# Patient Record
Sex: Female | Born: 1970 | Hispanic: No | Marital: Married | Smoking: Never smoker
Health system: Southern US, Community
[De-identification: ages and names within clinical notes are randomized; demographics above are authoritative.]

## PROBLEM LIST (undated history)

## (undated) DIAGNOSIS — R7302 Impaired glucose tolerance (oral): Secondary | ICD-10-CM

## (undated) DIAGNOSIS — M67919 Unspecified disorder of synovium and tendon, unspecified shoulder: Secondary | ICD-10-CM

## (undated) DIAGNOSIS — R51 Headache: Secondary | ICD-10-CM

## (undated) DIAGNOSIS — R002 Palpitations: Secondary | ICD-10-CM

## (undated) DIAGNOSIS — I10 Essential (primary) hypertension: Secondary | ICD-10-CM

## (undated) DIAGNOSIS — M719 Bursopathy, unspecified: Secondary | ICD-10-CM

## (undated) DIAGNOSIS — K219 Gastro-esophageal reflux disease without esophagitis: Secondary | ICD-10-CM

## (undated) DIAGNOSIS — E739 Lactose intolerance, unspecified: Secondary | ICD-10-CM

## (undated) DIAGNOSIS — Z9889 Other specified postprocedural states: Secondary | ICD-10-CM

## (undated) DIAGNOSIS — M7710 Lateral epicondylitis, unspecified elbow: Secondary | ICD-10-CM

## (undated) DIAGNOSIS — J45909 Unspecified asthma, uncomplicated: Secondary | ICD-10-CM

## (undated) DIAGNOSIS — R112 Nausea with vomiting, unspecified: Secondary | ICD-10-CM

## (undated) DIAGNOSIS — G43009 Migraine without aura, not intractable, without status migrainosus: Secondary | ICD-10-CM

## (undated) DIAGNOSIS — E785 Hyperlipidemia, unspecified: Secondary | ICD-10-CM

## (undated) DIAGNOSIS — M7500 Adhesive capsulitis of unspecified shoulder: Secondary | ICD-10-CM

## (undated) DIAGNOSIS — R509 Fever, unspecified: Secondary | ICD-10-CM

## (undated) DIAGNOSIS — M25519 Pain in unspecified shoulder: Secondary | ICD-10-CM

## (undated) DIAGNOSIS — D509 Iron deficiency anemia, unspecified: Secondary | ICD-10-CM

## (undated) HISTORY — DX: Migraine without aura, not intractable, without status migrainosus: G43.009

## (undated) HISTORY — PX: OTHER SURGICAL HISTORY: SHX169

## (undated) HISTORY — DX: Lateral epicondylitis, unspecified elbow: M77.10

## (undated) HISTORY — DX: Iron deficiency anemia, unspecified: D50.9

## (undated) HISTORY — DX: Hyperlipidemia, unspecified: E78.5

## (undated) HISTORY — DX: Fever, unspecified: R50.9

## (undated) HISTORY — DX: Lactose intolerance, unspecified: E73.9

## (undated) HISTORY — DX: Adhesive capsulitis of unspecified shoulder: M75.00

## (undated) HISTORY — DX: Headache: R51

## (undated) HISTORY — DX: Impaired glucose tolerance (oral): R73.02

## (undated) HISTORY — PX: FRACTURE SURGERY: SHX138

## (undated) HISTORY — PX: ABDOMINAL HYSTERECTOMY: SHX81

## (undated) HISTORY — DX: Unspecified disorder of synovium and tendon, unspecified shoulder: M67.919

## (undated) HISTORY — DX: Essential (primary) hypertension: I10

## (undated) HISTORY — DX: Unspecified asthma, uncomplicated: J45.909

## (undated) HISTORY — DX: Gastro-esophageal reflux disease without esophagitis: K21.9

## (undated) HISTORY — PX: TUBAL LIGATION: SHX77

## (undated) HISTORY — DX: Bursopathy, unspecified: M71.9

## (undated) HISTORY — DX: Pain in unspecified shoulder: M25.519

---

## 1998-07-23 ENCOUNTER — Emergency Department (HOSPITAL_COMMUNITY): Admission: EM | Admit: 1998-07-23 | Discharge: 1998-07-23 | Payer: Self-pay | Admitting: Emergency Medicine

## 1999-01-03 ENCOUNTER — Other Ambulatory Visit: Admission: RE | Admit: 1999-01-03 | Discharge: 1999-01-03 | Payer: Self-pay | Admitting: Obstetrics and Gynecology

## 1999-01-21 ENCOUNTER — Other Ambulatory Visit: Admission: RE | Admit: 1999-01-21 | Discharge: 1999-01-21 | Payer: Self-pay | Admitting: Obstetrics and Gynecology

## 1999-05-12 ENCOUNTER — Other Ambulatory Visit: Admission: RE | Admit: 1999-05-12 | Discharge: 1999-05-12 | Payer: Self-pay | Admitting: Obstetrics and Gynecology

## 1999-07-23 ENCOUNTER — Encounter: Admission: RE | Admit: 1999-07-23 | Discharge: 1999-10-21 | Payer: Self-pay | Admitting: *Deleted

## 1999-09-18 ENCOUNTER — Other Ambulatory Visit: Admission: RE | Admit: 1999-09-18 | Discharge: 1999-09-18 | Payer: Self-pay | Admitting: Obstetrics and Gynecology

## 1999-12-24 ENCOUNTER — Other Ambulatory Visit: Admission: RE | Admit: 1999-12-24 | Discharge: 1999-12-24 | Payer: Self-pay | Admitting: Obstetrics and Gynecology

## 2001-02-02 ENCOUNTER — Other Ambulatory Visit: Admission: RE | Admit: 2001-02-02 | Discharge: 2001-02-02 | Payer: Self-pay | Admitting: Obstetrics and Gynecology

## 2001-08-03 ENCOUNTER — Ambulatory Visit (HOSPITAL_BASED_OUTPATIENT_CLINIC_OR_DEPARTMENT_OTHER): Admission: RE | Admit: 2001-08-03 | Discharge: 2001-08-03 | Payer: Self-pay | Admitting: *Deleted

## 2002-06-07 ENCOUNTER — Other Ambulatory Visit: Admission: RE | Admit: 2002-06-07 | Discharge: 2002-06-07 | Payer: Self-pay | Admitting: Obstetrics & Gynecology

## 2003-08-28 ENCOUNTER — Other Ambulatory Visit: Admission: RE | Admit: 2003-08-28 | Discharge: 2003-08-28 | Payer: Self-pay | Admitting: Obstetrics & Gynecology

## 2003-12-14 ENCOUNTER — Encounter: Admission: RE | Admit: 2003-12-14 | Discharge: 2003-12-14 | Payer: Self-pay | Admitting: Family Medicine

## 2004-11-19 ENCOUNTER — Encounter: Admission: RE | Admit: 2004-11-19 | Discharge: 2004-12-16 | Payer: Self-pay | Admitting: Family Medicine

## 2004-11-28 ENCOUNTER — Other Ambulatory Visit: Admission: RE | Admit: 2004-11-28 | Discharge: 2004-11-28 | Payer: Self-pay | Admitting: Obstetrics and Gynecology

## 2004-12-30 ENCOUNTER — Ambulatory Visit (HOSPITAL_COMMUNITY): Admission: RE | Admit: 2004-12-30 | Discharge: 2004-12-30 | Payer: Self-pay | Admitting: Neurology

## 2005-11-02 HISTORY — PX: OTHER SURGICAL HISTORY: SHX169

## 2006-02-09 ENCOUNTER — Ambulatory Visit: Payer: Self-pay | Admitting: Internal Medicine

## 2006-02-10 ENCOUNTER — Other Ambulatory Visit: Admission: RE | Admit: 2006-02-10 | Discharge: 2006-02-10 | Payer: Self-pay | Admitting: Obstetrics and Gynecology

## 2006-08-19 ENCOUNTER — Encounter (INDEPENDENT_AMBULATORY_CARE_PROVIDER_SITE_OTHER): Payer: Self-pay | Admitting: *Deleted

## 2006-08-19 ENCOUNTER — Ambulatory Visit (HOSPITAL_COMMUNITY): Admission: RE | Admit: 2006-08-19 | Discharge: 2006-08-19 | Payer: Self-pay | Admitting: Obstetrics and Gynecology

## 2007-02-21 LAB — CONVERTED CEMR LAB: Pap Smear: NORMAL

## 2007-04-14 ENCOUNTER — Encounter (INDEPENDENT_AMBULATORY_CARE_PROVIDER_SITE_OTHER): Payer: Self-pay | Admitting: *Deleted

## 2007-05-04 ENCOUNTER — Ambulatory Visit (HOSPITAL_BASED_OUTPATIENT_CLINIC_OR_DEPARTMENT_OTHER): Admission: RE | Admit: 2007-05-04 | Discharge: 2007-05-04 | Payer: Self-pay | Admitting: Orthopaedic Surgery

## 2007-09-20 DIAGNOSIS — J45909 Unspecified asthma, uncomplicated: Secondary | ICD-10-CM | POA: Insufficient documentation

## 2007-09-20 DIAGNOSIS — D509 Iron deficiency anemia, unspecified: Secondary | ICD-10-CM | POA: Insufficient documentation

## 2007-09-20 DIAGNOSIS — R519 Headache, unspecified: Secondary | ICD-10-CM | POA: Insufficient documentation

## 2007-09-20 DIAGNOSIS — R51 Headache: Secondary | ICD-10-CM | POA: Insufficient documentation

## 2007-09-20 HISTORY — DX: Iron deficiency anemia, unspecified: D50.9

## 2007-09-20 HISTORY — DX: Unspecified asthma, uncomplicated: J45.909

## 2007-09-20 HISTORY — DX: Headache: R51

## 2008-01-17 ENCOUNTER — Ambulatory Visit: Payer: Self-pay | Admitting: Internal Medicine

## 2008-01-17 DIAGNOSIS — R509 Fever, unspecified: Secondary | ICD-10-CM

## 2008-01-17 DIAGNOSIS — R059 Cough, unspecified: Secondary | ICD-10-CM | POA: Insufficient documentation

## 2008-01-17 DIAGNOSIS — R05 Cough: Secondary | ICD-10-CM

## 2008-01-17 HISTORY — DX: Fever, unspecified: R50.9

## 2008-01-17 LAB — CONVERTED CEMR LAB
Basophils Relative: 0.8 % (ref 0.0–1.0)
Bilirubin Urine: NEGATIVE
Bilirubin, Direct: 0.1 mg/dL (ref 0.0–0.3)
CO2: 30 meq/L (ref 19–32)
Creatinine, Ser: 0.7 mg/dL (ref 0.4–1.2)
Crystals: NEGATIVE
Glucose, Bld: 132 mg/dL — ABNORMAL HIGH (ref 70–99)
Hemoglobin: 12.3 g/dL (ref 12.0–15.0)
Lymphocytes Relative: 26.8 % (ref 12.0–46.0)
MCHC: 32.6 g/dL (ref 30.0–36.0)
MCV: 84.6 fL (ref 78.0–100.0)
Monocytes Absolute: 0.4 10*3/uL (ref 0.2–0.7)
Monocytes Relative: 5.1 % (ref 3.0–11.0)
Neutro Abs: 5.7 10*3/uL (ref 1.4–7.7)
Nitrite: NEGATIVE
Potassium: 4.1 meq/L (ref 3.5–5.1)
Sodium: 140 meq/L (ref 135–145)
Specific Gravity, Urine: >=1.03 (ref 1.000–1.03)
Total Bilirubin: 0.5 mg/dL (ref 0.3–1.2)
Total Protein: 6.4 g/dL (ref 6.0–8.3)
Urine Glucose: NEGATIVE mg/dL
Urobilinogen, UA: 0.2 (ref 0.0–1.0)
WBC, UA: NONE SEEN cells/hpf

## 2008-01-18 ENCOUNTER — Telehealth: Payer: Self-pay | Admitting: Internal Medicine

## 2008-03-15 LAB — CONVERTED CEMR LAB: Pap Smear: NORMAL

## 2008-05-24 ENCOUNTER — Telehealth: Payer: Self-pay | Admitting: Internal Medicine

## 2008-05-29 ENCOUNTER — Encounter: Payer: Self-pay | Admitting: Internal Medicine

## 2008-06-10 ENCOUNTER — Telehealth: Payer: Self-pay | Admitting: Internal Medicine

## 2009-01-02 ENCOUNTER — Ambulatory Visit: Payer: Self-pay | Admitting: Internal Medicine

## 2009-01-02 DIAGNOSIS — E739 Lactose intolerance, unspecified: Secondary | ICD-10-CM | POA: Insufficient documentation

## 2009-01-02 DIAGNOSIS — G43009 Migraine without aura, not intractable, without status migrainosus: Secondary | ICD-10-CM | POA: Insufficient documentation

## 2009-01-02 DIAGNOSIS — I10 Essential (primary) hypertension: Secondary | ICD-10-CM | POA: Insufficient documentation

## 2009-01-02 DIAGNOSIS — K219 Gastro-esophageal reflux disease without esophagitis: Secondary | ICD-10-CM | POA: Insufficient documentation

## 2009-01-02 HISTORY — DX: Lactose intolerance, unspecified: E73.9

## 2009-01-02 HISTORY — DX: Gastro-esophageal reflux disease without esophagitis: K21.9

## 2009-01-02 HISTORY — DX: Migraine without aura, not intractable, without status migrainosus: G43.009

## 2009-01-02 HISTORY — DX: Essential (primary) hypertension: I10

## 2009-01-02 LAB — CONVERTED CEMR LAB
ALT: 26 units/L (ref 0–35)
AST: 20 units/L (ref 0–37)
Albumin: 3.4 g/dL — ABNORMAL LOW (ref 3.5–5.2)
BUN: 13 mg/dL (ref 6–23)
Bacteria, UA: NEGATIVE
Basophils Absolute: 0 10*3/uL (ref 0.0–0.1)
Basophils Relative: 0.2 % (ref 0.0–3.0)
CO2: 29 meq/L (ref 19–32)
Calcium: 9.2 mg/dL (ref 8.4–10.5)
Cholesterol: 205 mg/dL (ref 0–200)
Creatinine, Ser: 0.6 mg/dL (ref 0.4–1.2)
Direct LDL: 135.5 mg/dL
Eosinophils Relative: 1.1 % (ref 0.0–5.0)
Glucose, Bld: 94 mg/dL (ref 70–99)
Hemoglobin: 13.7 g/dL (ref 12.0–15.0)
Hgb A1c MFr Bld: 5.9 % (ref 4.6–6.0)
Ketones, ur: NEGATIVE mg/dL
Leukocytes, UA: NEGATIVE
Lymphocytes Relative: 23.1 % (ref 12.0–46.0)
MCHC: 34 g/dL (ref 30.0–36.0)
MCV: 84.8 fL (ref 78.0–100.0)
Neutro Abs: 6.9 10*3/uL (ref 1.4–7.7)
RBC: 4.75 M/uL (ref 3.87–5.11)
Specific Gravity, Urine: 1.015 (ref 1.000–1.035)
Total Bilirubin: 0.7 mg/dL (ref 0.3–1.2)
Total Protein: 7.2 g/dL (ref 6.0–8.3)
Urine Glucose: NEGATIVE mg/dL
pH: 7 (ref 5.0–8.0)

## 2009-05-13 ENCOUNTER — Emergency Department (HOSPITAL_COMMUNITY): Admission: EM | Admit: 2009-05-13 | Discharge: 2009-05-13 | Payer: Self-pay | Admitting: Emergency Medicine

## 2009-05-27 ENCOUNTER — Ambulatory Visit: Payer: Self-pay | Admitting: Sports Medicine

## 2009-05-27 DIAGNOSIS — M719 Bursopathy, unspecified: Secondary | ICD-10-CM

## 2009-05-27 DIAGNOSIS — M25519 Pain in unspecified shoulder: Secondary | ICD-10-CM

## 2009-05-27 DIAGNOSIS — M67919 Unspecified disorder of synovium and tendon, unspecified shoulder: Secondary | ICD-10-CM

## 2009-05-27 HISTORY — DX: Unspecified disorder of synovium and tendon, unspecified shoulder: M67.919

## 2009-05-27 HISTORY — DX: Bursopathy, unspecified: M71.9

## 2009-05-27 HISTORY — DX: Pain in unspecified shoulder: M25.519

## 2009-06-05 ENCOUNTER — Encounter (INDEPENDENT_AMBULATORY_CARE_PROVIDER_SITE_OTHER): Payer: Self-pay | Admitting: *Deleted

## 2009-06-05 ENCOUNTER — Ambulatory Visit: Payer: Self-pay | Admitting: Sports Medicine

## 2009-06-05 DIAGNOSIS — M7710 Lateral epicondylitis, unspecified elbow: Secondary | ICD-10-CM | POA: Insufficient documentation

## 2009-06-05 HISTORY — DX: Lateral epicondylitis, unspecified elbow: M77.10

## 2009-06-10 ENCOUNTER — Encounter (INDEPENDENT_AMBULATORY_CARE_PROVIDER_SITE_OTHER): Payer: Self-pay | Admitting: *Deleted

## 2009-06-19 ENCOUNTER — Telehealth (INDEPENDENT_AMBULATORY_CARE_PROVIDER_SITE_OTHER): Payer: Self-pay | Admitting: *Deleted

## 2009-06-20 ENCOUNTER — Encounter: Admission: RE | Admit: 2009-06-20 | Discharge: 2009-09-18 | Payer: Self-pay | Admitting: Sports Medicine

## 2009-06-20 ENCOUNTER — Encounter: Payer: Self-pay | Admitting: Sports Medicine

## 2009-06-25 ENCOUNTER — Encounter: Payer: Self-pay | Admitting: Sports Medicine

## 2009-07-03 ENCOUNTER — Ambulatory Visit: Payer: Self-pay | Admitting: Internal Medicine

## 2009-07-05 ENCOUNTER — Encounter: Payer: Self-pay | Admitting: Internal Medicine

## 2009-07-05 LAB — CONVERTED CEMR LAB
BUN: 13 mg/dL
CO2: 32 meq/L
Calcium: 9.3 mg/dL
Chloride: 102 meq/L
Cholesterol: 185 mg/dL
Creatinine, Ser: 0.8 mg/dL
GFR calc non Af Amer: 86.21 mL/min
Glucose, Bld: 103 mg/dL — ABNORMAL HIGH
HDL: 43.3 mg/dL
Hgb A1c MFr Bld: 5.7 %
LDL Cholesterol: 116 mg/dL — ABNORMAL HIGH
Potassium: 3.2 meq/L — ABNORMAL LOW
Sodium: 140 meq/L
Total CHOL/HDL Ratio: 4
Triglycerides: 130 mg/dL
VLDL: 26 mg/dL

## 2009-07-10 ENCOUNTER — Ambulatory Visit: Payer: Self-pay | Admitting: Sports Medicine

## 2009-07-10 ENCOUNTER — Encounter (INDEPENDENT_AMBULATORY_CARE_PROVIDER_SITE_OTHER): Payer: Self-pay | Admitting: *Deleted

## 2009-07-10 ENCOUNTER — Ambulatory Visit: Payer: Self-pay | Admitting: Internal Medicine

## 2009-07-10 DIAGNOSIS — M7500 Adhesive capsulitis of unspecified shoulder: Secondary | ICD-10-CM | POA: Insufficient documentation

## 2009-07-10 HISTORY — DX: Adhesive capsulitis of unspecified shoulder: M75.00

## 2009-07-15 ENCOUNTER — Telehealth: Payer: Self-pay | Admitting: Sports Medicine

## 2009-07-31 ENCOUNTER — Encounter: Payer: Self-pay | Admitting: Sports Medicine

## 2009-08-12 ENCOUNTER — Ambulatory Visit: Payer: Self-pay | Admitting: Sports Medicine

## 2009-08-12 ENCOUNTER — Encounter: Payer: Self-pay | Admitting: Sports Medicine

## 2009-09-04 ENCOUNTER — Encounter (INDEPENDENT_AMBULATORY_CARE_PROVIDER_SITE_OTHER): Payer: Self-pay | Admitting: *Deleted

## 2009-09-04 ENCOUNTER — Encounter: Payer: Self-pay | Admitting: Sports Medicine

## 2009-09-11 ENCOUNTER — Ambulatory Visit: Payer: Self-pay | Admitting: Sports Medicine

## 2009-09-19 ENCOUNTER — Encounter: Admission: RE | Admit: 2009-09-19 | Discharge: 2009-10-03 | Payer: Self-pay | Admitting: Sports Medicine

## 2009-09-30 ENCOUNTER — Ambulatory Visit: Payer: Self-pay | Admitting: Sports Medicine

## 2009-10-02 ENCOUNTER — Encounter (INDEPENDENT_AMBULATORY_CARE_PROVIDER_SITE_OTHER): Payer: Self-pay | Admitting: *Deleted

## 2009-10-14 ENCOUNTER — Ambulatory Visit: Payer: Self-pay | Admitting: Sports Medicine

## 2009-10-21 ENCOUNTER — Encounter (INDEPENDENT_AMBULATORY_CARE_PROVIDER_SITE_OTHER): Payer: Self-pay | Admitting: *Deleted

## 2009-10-31 ENCOUNTER — Ambulatory Visit: Payer: Self-pay | Admitting: Sports Medicine

## 2009-11-11 ENCOUNTER — Ambulatory Visit: Payer: Self-pay | Admitting: Sports Medicine

## 2009-11-11 ENCOUNTER — Encounter: Payer: Self-pay | Admitting: Family Medicine

## 2009-11-18 ENCOUNTER — Ambulatory Visit (HOSPITAL_COMMUNITY): Admission: RE | Admit: 2009-11-18 | Discharge: 2009-11-18 | Payer: Self-pay | Admitting: Sports Medicine

## 2009-12-03 ENCOUNTER — Ambulatory Visit: Payer: Self-pay | Admitting: Sports Medicine

## 2009-12-03 ENCOUNTER — Encounter: Payer: Self-pay | Admitting: Family Medicine

## 2010-01-02 ENCOUNTER — Ambulatory Visit (HOSPITAL_BASED_OUTPATIENT_CLINIC_OR_DEPARTMENT_OTHER): Admission: RE | Admit: 2010-01-02 | Discharge: 2010-01-02 | Payer: Self-pay | Admitting: Orthopaedic Surgery

## 2010-01-28 ENCOUNTER — Telehealth: Payer: Self-pay | Admitting: Internal Medicine

## 2010-03-10 ENCOUNTER — Telehealth: Payer: Self-pay | Admitting: Internal Medicine

## 2010-09-26 ENCOUNTER — Observation Stay (HOSPITAL_COMMUNITY): Admission: EM | Admit: 2010-09-26 | Discharge: 2010-09-27 | Payer: Self-pay | Admitting: Emergency Medicine

## 2010-12-02 NOTE — Letter (Signed)
Summary: *Referral Letter  [facility]  [address]   [place], [place] [address]   Phone: [phone]  Fax: [phone]    12/03/2009  Thank you in advance for agreeing to see my patient:  Cassidy [name] [address] [place], [place]  [address]  Phone: [phone]  Reason for Referral: Right lateral epicondylitis and possible ulnar nerve entrapment. Right adhesive capsulitis with a negative MRI.  Procedures Requested: Evaluation for possible debridement of her right lateral epicondyle. She previously had a successful debridement of her left lateral epicondyle in about 2009 and has thus far failed conservative treatment for her right lateral epicondylitis. She has also recently developed right adhesive capsulitis which is slowly improving.   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) 2)  LATERAL EPICONDYLITIS, RIGHT (ICD-726.32) 3)  ROTATOR CUFF SYNDROME, RIGHT (ICD-726.10) 4)  SHOULDER PAIN, RIGHT (ICD-719.41) 5)  GLUCOSE INTOLERANCE (ICD-271.3) 6)  PREVENTIVE HEALTH CARE (ICD-V70.0) 7)  HYPERTENSION (ICD-401.9) 8)  GERD (ICD-530.81) 9)  COMMON MIGRAINE (ICD-346.10) 10)  FEVER UNSPECIFIED (ICD-780.60) 11)  COUGH (ICD-786.2) 12)  HEADACHE (ICD-784.0) 13)  ASTHMA (ICD-493.90) 14)  ANEMIA-IRON DEFICIENCY (ICD-280.9)   Current Medications: 1)  PROAIR HFA 108 (90 BASE) MCG/ACT AERS (ALBUTEROL SULFATE) 2 puffs four times per day as needed 2)  ALLEGRA 180 MG  TABS (FEXOFENADINE HCL) Take 1 tablet by mouth once a day as needed 3)  NEXIUM 40 MG CPDR (ESOMEPRAZOLE MAGNESIUM) 1 by mouth once daily 4)  DAYPRO 600 MG TABS (OXAPROZIN) pt unsure of dose and amount taken 5)  HYDROCHLOROTHIAZIDE 25 MG TABS (HYDROCHLOROTHIAZIDE) 1po once daily 6)  KLOR-CON 10 10 MEQ CR-TABS (POTASSIUM CHLORIDE) 1 by mouth once daily 7)  NITROGLYCERIN 0.2 MG/HR PT24 (NITROGLYCERIN) use 1/4 patch daily to RT elbow 8)  DIAZEPAM 2 MG TABS (DIAZEPAM) 1 by mouth qhs   Past  Medical History: 1)  Anemia-iron deficiency 2)  Asthma 3)  Headache/migraine - dr lewit 4)  GERD 5)  Hypertension 6)  glucose intolerance 7)  Hyperlipidemia   Prior History of Blood Transfusions: Unknown  Pertinent Labs: None   Thank you again for agreeing to see our patient; please contact us if you have any further questions or need additional information.  Sincerely,  Jannifer Rodney MD

## 2010-12-02 NOTE — Progress Notes (Signed)
  Phone Note Refill Request  on January 28, 2010 9:52 AM  Refills Requested: Medication #1:  ALLEGRA 180 MG  TABS Take 1 tablet by mouth once a day as needed   Dosage confirmed as above?Dosage Confirmed   Notes: [facility] [address] [place] Initial call taken by: Scharlene Gloss,  January 28, 2010 9:53 AM    Prescriptions: HYDROCHLOROTHIAZIDE 25 MG TABS (HYDROCHLOROTHIAZIDE) 1po once daily  #100 x 0   Entered by:   Scharlene Gloss   Authorized by:   Corwin Levins MD   Signed by:   Scharlene Gloss on 01/28/2010   Method used:   Electronically to        [facility] [phone]* (retail)       [address] [place], [place]  [address]       Ph: [phone]       Fax: [phone]   RxID:   [id number] ALLEGRA 180 MG  TABS (FEXOFENADINE HCL) Take 1 tablet by mouth once a day as needed  #90 x 0   Entered by:   Scharlene Gloss   Authorized by:   Corwin Levins MD   Signed by:   Scharlene Gloss on 01/28/2010   Method used:   Electronically to        [facility] [phone]* (retail)       [address] [place], [place]  [address]       Ph: [phone]       Fax: [phone]   RxID:   [id number]

## 2010-12-02 NOTE — Assessment & Plan Note (Signed)
Summary: FU/MJD   Vital Signs:  Patient profile:   40 year old female BP sitting:   127 / 83  Vitals Entered By: Lillia Pauls CMA (November 11, 2009 11:59 AM)  CC:  RIGHT shoulder and elbow pain.  History of Present Illness: Patient with RIGHT frozen shoulder and presumed lateral epicondylitis.  Left work this morning secondary to acute exacerbation of symptoms.  Shoulder has not improved and seems more frozen to patient today and with more limited range of motion.    Lateral elbow pain has progressed from small point tenderness to larger (several centimeter) lateral patch and now involves paresthesias of the 2nd-4th digits without weakness.  Note on injection on 12/13 she really got no relief However in past she always did get fairly prompt relief sxs this time differ in that the are associated with the tingling sensation - more in a radial nerve distribution  these tend to worsen as her shoulder sxs wax and wane  Allergies: 1)  ! Imitrex 2)  ! Singulair (Montelukast Sodium) 3)  ! Propranolol Hcl (Propranolol Hcl) 4)  ! Vancomycin 5)  ! Cleocin 6)  ! * Frova  Physical Exam  General:  Well-developed,well-nourished,in no acute distress; alert,appropriate and cooperative throughout examination Msk:  RIGHT ELBOW:  TTP lateral epicondyle.  Normal hand strength: grip and finger adduction.  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  Will go ahead and get an MRI we need to detail what is treatable and what is going to be slow to better determine her MMI also important to determine if shoulder and elbow sxs are actually rleated and not separate entities  Once we have MRI and consult data would confirm the findings with patient and have her work  with case manager regarding her work status  Problem # 2:  LATERAL EPICONDYLITIS, RIGHT (ICD-726.32) Assessment: Deteriorated Seems to have gotten worse acutely.  Given radicular symptoms in hand now question nerve involvement and also wonder if it is related to shoulder problems.  Will refer as above, #1.  Complete Medication List: 1)  Proair Hfa 108 (90 Base) Mcg/act Aers (Albuterol sulfate) .... 2 puffs four times per day as needed 2)  Allegra 180 Mg Tabs (Fexofenadine hcl) .... Take 1 tablet by mouth once a day as needed 3)  Nexium 40 Mg Cpdr (Esomeprazole magnesium) .Marland Kitchen.. 1 by mouth once daily 4)  Daypro 600 Mg Tabs (Oxaprozin) .... Pt unsure of dose and amount taken 5)  Hydrochlorothiazide 25 Mg Tabs (Hydrochlorothiazide) .Marland Kitchen.. 1po once daily 6)  Klor-con 10 10 Meq Cr-tabs (Potassium chloride) .Marland Kitchen.. 1 by mouth once daily 7)  Nitroglycerin 0.2 Mg/hr Pt24 (Nitroglycerin) .... Use 1/4 patch daily to rt elbow 8)  Diazepam 2 Mg Tabs (Diazepam) .Marland Kitchen.. 1 by mouth qhs  Appended Document: FU/MJD   Appended Document: FU/MJD Pt has appt for MRI of R shoulder at [facility] on monday, january 17th at 6pm. Pt will need to arrive in admitting at 5:30 for registration. Their number is [phone] if pt needs to cancel or change the appt

## 2010-12-02 NOTE — Progress Notes (Signed)
Summary: Rx refill  Phone Note Call from Patient Call back at Home Phone [phone]   Caller: Patient Summary of Call: pt called stating that she has CPX scheduled 06/16. Pt will run out of medications before appt and is requesting refill to last until appt.  Initial call taken by: Margaret Pyle, CMA,  Mar 10, 2010 2:23 PM    Prescriptions: NEXIUM 40 MG CPDR (ESOMEPRAZOLE MAGNESIUM) 1 by mouth once daily  #30 x 1   Entered by:   Margaret Pyle, CMA   Authorized by:   Corwin Levins MD   Signed by:   Margaret Pyle, CMA on 03/10/2010   Method used:   Electronically to        [facility]* (retail)       [address]       [address]       [place], [place]  [address]       Ph: [phone]       Fax: [phone]   RxID:   [id number]

## 2010-12-02 NOTE — Assessment & Plan Note (Signed)
Summary: 1 MONTH FU WORK COMP/MJD   Vital Signs:  Patient profile:   40 year old female BP sitting:   133 / 83  Vitals Entered By: Lillia Pauls CMA (December 03, 2009 8:59 AM)  History of Present Illness:   The patient presents today for follow-up of right adhesive capuslitis of her right shoulder and right lateral epicondylitis. This is a worker's comp case. She has been doing the home range of motion exercises to stretch her right shoulder and had an MRI performed recently which did not show any signs of a rotator cuff tear, labrum tear or significant radiological signs of adhesive capsulitis. She is still having pain while sleeping and is now having shoulder spasms regularly. She is currently taking Aleve for pain in addition to as needed flexeril and valium at night.    Her right elbow continues to have episodes of falling asleep intermittently with pain at her lateral elbow that is fairly constant and severe. Most of her pain is located at her lateral right elbow but she has problems with numbness, tingling and pain in her right 4th and 5th fingers. The sensations in her fingers have become progressively worse as of recently. The last steroid injection for her lateral epicondylitis on 10/14/2009 did not provide her with any relief. She does have a history of severe left lateral epicondylitis that was debrided and treated by Dr. Ophelia Charter. At this time, she is interested in pursuing surgery again. She has used a nitroglycerin patch as well and has developed a non-puritis skin rash. She has not yet noticed a change in her pain symptoms while using the nitroglycerin patch.   Allergies: 1)  ! Imitrex 2)  ! Singulair (Montelukast Sodium) 3)  ! Propranolol Hcl (Propranolol Hcl) 4)  ! Vancomycin 5)  ! Cleocin 6)  ! * Frova  Physical Exam  General:  alert and well-developed.   Head:  normocephalic and atraumatic.   Msk:  Right Shoulder: No bony abnormalities, edema or bruising Active forward  flexion to 95 degress and active abduction to 95 degress Passive forward flexion to 130 degrees and passive abduction to 130 degrees 4/5 strength with resisted internal rotation and 5/5 strength with resisted external rotation Able to put her right hand to L4 behind her back Pain with Neer's testing due to capsulitis Neg cross over and Hawkin's Pain lead to decreased strength on empty can testing No TTP along the [facility] joint but mild pain along biceps tendon Speed's test was painful  Left Shoulder: No bony abnormalities, edema or bruising Full ROM without pain 5/5 strength with resisted internal and external rotation Neg Hawkin's, cross over, empty can testing and Neer's testing Able to put her hand up to T12 behind her back No TTP throughout  Right Elbow: No swelling or bruising + TTP at the lateral epicondyle Full ROM of the elbow but pain with resisted pronation and wrist extension 5/5 strength with resisted elbow extension and flexion  Left Elbow: No bruising or swelling. Well healed scar. 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 Encouraged her to continue to push her ROM stretches and exercises since her MRI does not show any tears at this time 2. Can continue her medications for pain 3. Will have her return in 4 weeks or follow-up with Dr. Ophelia Charter about her right shoulder  Problem # 2:  LATERAL EPICONDYLITIS, RIGHT (ICD-726.32) Assessment: Unchanged 1. Will refer her back to Dr. Ophelia Charter for a consultation for her right lateral epicondylitis and possible ulnar nerve entrapment 2. Continue the nitroglycerin treatments until the consultation has taken place 3. Continue current stretching and pain medications  Complete Medication List: 1)  Proair Hfa 108 (90 Base) Mcg/act Aers (Albuterol sulfate) .... 2  puffs four times per day as needed 2)  Allegra 180 Mg Tabs (Fexofenadine hcl) .... Take 1 tablet by mouth once a day as needed 3)  Nexium 40 Mg Cpdr (Esomeprazole magnesium) .Marland Kitchen.. 1 by mouth once daily 4)  Daypro 600 Mg Tabs (Oxaprozin) .... Pt unsure of dose and amount taken 5)  Hydrochlorothiazide 25 Mg Tabs (Hydrochlorothiazide) .Marland Kitchen.. 1po once daily 6)  Klor-con 10 10 Meq Cr-tabs (Potassium chloride) .Marland Kitchen.. 1 by mouth once daily 7)  Nitroglycerin 0.2 Mg/hr Pt24 (Nitroglycerin) .... Use 1/4 patch daily to rt elbow 8)  Diazepam 2 Mg Tabs (Diazepam) .Marland Kitchen.. 1 by mouth qhs

## 2010-12-02 NOTE — Letter (Signed)
Summary: Out of Work  [facility]  [address]   [place], [place] [address]   Phone: [phone]  Fax: [phone]    November 11, 2009   Employee:  HALLY COLELLA Pointer    To Whom It May Concern:   For Medical reasons, please excuse the above named employee from work for the following dates:  Start:  November 11, 2009   End:  November 12, 2009   If you need additional information, please feel free to contact our office.         Sincerely,    Romero Belling MD

## 2011-01-13 LAB — POCT I-STAT, CHEM 8
BUN: 10 mg/dL (ref 6–23)
Calcium, Ion: 0.88 mmol/L — ABNORMAL LOW (ref 1.12–1.32)
Chloride: 106 mEq/L (ref 96–112)
HCT: 42 % (ref 36.0–46.0)
Sodium: 136 mEq/L (ref 135–145)

## 2011-01-13 LAB — POCT PREGNANCY, URINE: Preg Test, Ur: NEGATIVE

## 2011-01-25 LAB — BASIC METABOLIC PANEL
CO2: 30 mEq/L (ref 19–32)
Calcium: 8.9 mg/dL (ref 8.4–10.5)
Chloride: 100 mEq/L (ref 96–112)
Creatinine, Ser: 0.76 mg/dL (ref 0.4–1.2)
GFR calc Af Amer: >60 mL/min (ref >60)
Sodium: 135 mEq/L (ref 135–145)

## 2011-01-25 LAB — POCT HEMOGLOBIN-HEMACUE: Hemoglobin: 13.8 g/dL (ref 12.0–15.0)

## 2011-03-17 NOTE — Op Note (Signed)
NAME:  [name], Cassidy                     ACCOUNT NO.:  [id number]   MEDICAL RECORD NO.:  [id number]          PATIENT TYPE:  AMB   LOCATION:  [facility]                          FACILITY:  [facility]   PHYSICIAN:  Mark C. Ophelia Charter, M.D.    DATE OF BIRTH:  02/23/1971   DATE OF PROCEDURE:  05/04/2007  DATE OF DISCHARGE:                               OPERATIVE REPORT   PREOPERATIVE DIAGNOSIS:  Chronic left lateral epicondylitis.   POSTOPERATIVE DIAGNOSIS:  Chronic left lateral epicondylitis.   PROCEDURE:  Left lateral epicondylar release, drilling and repair.   SURGEON:  Annell Greening, MD   ANESTHESIA:  General.   PROCEDURE:  After induction of anesthesia with MAL tube placement,  standard prepping and draping up to the proximal arm tourniquet was  performed with DuraPrep.  The patient has had a history of MRSA and was  taking some Bactrim.  With this history, it was decided to go ahead and  give her a gram of vancomycin, which was hung and was dripping in.  Incision was made over the lateral epicondyle and the tourniquet was  initially inflated as a skin incision was made and subcutaneous tissue  sharply dissected down to the lateral epicondyle; hemostasis was  obtained.  Tourniquet was then deflated as the vancomycin was being  started.  Lateral epicondyle was split and abnormal tendon was excised.  The lateral epicondyle was exposed; it was debrided with the rongeur  initially down to bleeding bone surface and then a 5-7 drill holes were  drilled using a 0.45 K-wire and a power drill; this gave good bleeding  bone surface.  Split in the tendon was then repaired back with 2-0  Vicryl sutures, suturing it to adjacent intact tissue.  The joint was  checked prior to closure and the capitellum was normal.  Visualization  of the radial head was normal.  After irrigation with saline solution, 2-  0 Vicryl was used in the subcutaneous tissue, 4-0 Vicryl subcuticular  skin closure, tincture of Benzoin,  Steri-Strips, Marcaine infiltration,  postop dressing and long arm splint from the hand to the upper arm with  the elbow at 90 degrees and forearm at neutral rotation.  Outpatient  surgery was the appropriate treatment for this condition.  Office  followup is in 1 week.      Mark C. Ophelia Charter, M.D.  Electronically Signed     MCY/MEDQ  D:  05/04/2007  T:  05/05/2007  Job:  [id number]

## 2011-05-28 ENCOUNTER — Other Ambulatory Visit: Payer: Self-pay

## 2011-05-28 NOTE — Telephone Encounter (Signed)
No, last appt sept 2010  Needs OV

## 2011-05-28 NOTE — Telephone Encounter (Signed)
Called pharmacy informed of MD's instruction.

## 2011-05-28 NOTE — Telephone Encounter (Signed)
Patient requesting to refill his Ventolin. Patient is scheduled to see you for OV 07/20/2011 as new patient.. Please advise for refill.

## 2011-07-07 ENCOUNTER — Encounter: Payer: Self-pay | Admitting: Internal Medicine

## 2011-07-15 ENCOUNTER — Telehealth: Payer: Self-pay

## 2011-07-15 DIAGNOSIS — Z0000 Encounter for general adult medical examination without abnormal findings: Secondary | ICD-10-CM

## 2011-07-15 NOTE — Telephone Encounter (Signed)
Put order in for labs. 

## 2011-07-16 ENCOUNTER — Other Ambulatory Visit (INDEPENDENT_AMBULATORY_CARE_PROVIDER_SITE_OTHER): Payer: Self-pay

## 2011-07-16 DIAGNOSIS — Z0000 Encounter for general adult medical examination without abnormal findings: Secondary | ICD-10-CM

## 2011-07-16 LAB — CBC WITH DIFFERENTIAL/PLATELET
Basophils Relative: 0.6 % (ref 0.0–3.0)
Eosinophils Relative: 2.6 % (ref 0.0–5.0)
MCV: 84.6 fl (ref 78.0–100.0)
Monocytes Relative: 4 % (ref 3.0–12.0)
Neutrophils Relative %: 61.5 % (ref 43.0–77.0)
Platelets: 299 10*3/uL (ref 150.0–400.0)
RBC: 4.21 Mil/uL (ref 3.87–5.11)
WBC: 9 10*3/uL (ref 4.5–10.5)

## 2011-07-16 LAB — LIPID PANEL
Cholesterol: 175 mg/dL (ref 0–200)
VLDL: 31.8 mg/dL (ref 0.0–40.0)

## 2011-07-16 LAB — BASIC METABOLIC PANEL
CO2: 32 mEq/L (ref 19–32)
Glucose, Bld: 94 mg/dL (ref 70–99)
Potassium: 3.1 mEq/L — ABNORMAL LOW (ref 3.5–5.1)
Sodium: 140 mEq/L (ref 135–145)

## 2011-07-16 LAB — URINALYSIS, ROUTINE W REFLEX MICROSCOPIC
Ketones, ur: NEGATIVE
Specific Gravity, Urine: 1.025 (ref 1.000–1.030)
Urine Glucose: NEGATIVE
Urobilinogen, UA: 0.2 (ref 0.0–1.0)

## 2011-07-16 LAB — HEPATIC FUNCTION PANEL
AST: 15 U/L (ref 0–37)
Albumin: 3.3 g/dL — ABNORMAL LOW (ref 3.5–5.2)
Alkaline Phosphatase: 53 U/L (ref 39–117)
Total Protein: 6.7 g/dL (ref 6.0–8.3)

## 2011-07-17 ENCOUNTER — Encounter: Payer: Self-pay | Admitting: Internal Medicine

## 2011-07-17 DIAGNOSIS — Z0000 Encounter for general adult medical examination without abnormal findings: Secondary | ICD-10-CM | POA: Insufficient documentation

## 2011-07-17 DIAGNOSIS — R7302 Impaired glucose tolerance (oral): Secondary | ICD-10-CM

## 2011-07-17 HISTORY — DX: Impaired glucose tolerance (oral): R73.02

## 2011-07-20 ENCOUNTER — Encounter: Payer: Self-pay | Admitting: Internal Medicine

## 2011-07-20 ENCOUNTER — Ambulatory Visit (INDEPENDENT_AMBULATORY_CARE_PROVIDER_SITE_OTHER): Payer: 59 | Admitting: Internal Medicine

## 2011-07-20 VITALS — BP 102/60 | HR 66 | Temp 98.4°F | Ht 67.0 in | Wt 258.1 lb

## 2011-07-20 DIAGNOSIS — Z0000 Encounter for general adult medical examination without abnormal findings: Secondary | ICD-10-CM

## 2011-07-20 DIAGNOSIS — R319 Hematuria, unspecified: Secondary | ICD-10-CM | POA: Insufficient documentation

## 2011-07-20 MED ORDER — FEXOFENADINE HCL 60 MG PO TABS: 60.0000 mg | ORAL_TABLET | Freq: Two times a day (BID) | ORAL | Status: DC

## 2011-07-20 MED ORDER — DIAZEPAM 2 MG PO TABS: 2.0000 mg | ORAL_TABLET | Freq: Every evening | ORAL | Status: DC | PRN

## 2011-07-20 MED ORDER — ALBUTEROL SULFATE HFA 108 (90 BASE) MCG/ACT IN AERS: 2.0000 | INHALATION_SPRAY | Freq: Four times a day (QID) | RESPIRATORY_TRACT | Status: DC

## 2011-07-20 MED ORDER — ESOMEPRAZOLE MAGNESIUM 40 MG PO CPDR: 40.0000 mg | DELAYED_RELEASE_CAPSULE | Freq: Every day | ORAL | Status: DC

## 2011-07-20 MED ORDER — HYDROCHLOROTHIAZIDE 25 MG PO TABS: 25.0000 mg | ORAL_TABLET | Freq: Every day | ORAL | Status: DC

## 2011-07-20 MED ORDER — POTASSIUM CHLORIDE 10 MEQ PO TBCR: 10.0000 meq | EXTENDED_RELEASE_TABLET | Freq: Every day | ORAL | Status: DC

## 2011-07-20 NOTE — Assessment & Plan Note (Signed)

## 2011-07-20 NOTE — Patient Instructions (Signed)
Continue all other medications as before Your refills were done today Please return in 1 year for your yearly visit, or sooner if needed, with Lab testing done 3-5 days before

## 2011-07-20 NOTE — Progress Notes (Signed)
Subjective:    Patient ID: Cassidy [name], female    DOB: 1971-09-01, 40 y.o.   MRN: [id number]  HPI  Here for wellness and f/u;  Overall doing ok;  Pt denies CP, worsening SOB, DOE, wheezing, orthopnea, PND, worsening LE edema, palpitations, dizziness or syncope.  Pt denies neurological change such as new Headache, facial or extremity weakness.  Pt denies polydipsia, polyuria, or low sugar symptoms. Pt states overall good compliance with treatment and medications, good tolerability, and trying to follow lower cholesterol diet.  Pt denies worsening depressive symptoms, suicidal ideation or panic. No fever, wt loss, night sweats, loss of appetite, or other constitutional symptoms.  Pt states good ability with ADL's, low fall risk, home safety reviewed and adequate, no significant changes in hearing or vision, and occasionally active with exercise.  Did have surgury to right elbow 2011 (lateral epicondyle partial tear) after fall at work per Dr Ophelia Charter;  Also fell nov 2011 with fx left ankle required surgury - Dr Lajoyce Corners.  Mentions she has not been taking the K supplement lately Past Medical History  Diagnosis Date  . ANEMIA-IRON DEFICIENCY 09/20/2007  . ASTHMA 09/20/2007  . COMMON MIGRAINE 01/02/2009  . FEVER UNSPECIFIED 01/17/2008  . FROZEN RIGHT SHOULDER 07/10/2009  . GERD 01/02/2009  . GLUCOSE INTOLERANCE 01/02/2009  . Headache 09/20/2007  . HYPERTENSION 01/02/2009  . LATERAL EPICONDYLITIS, RIGHT 06/05/2009  . ROTATOR CUFF SYNDROME, RIGHT 05/27/2009  . SHOULDER PAIN, RIGHT 05/27/2009  . Impaired glucose tolerance 07/17/2011   Past Surgical History  Procedure Date  . Tubal ligation 1955  . S/p uterine ablation 2007  . S/p epicondylar release   . S/p leep procedure 2000 for abnormal pap     Normal since then    reports that she has never smoked. She does not have any smokeless tobacco history on file. She reports that she does not drink alcohol or use illicit drugs. family history includes Colon cancer in her  paternal grandfather; Diabetes in her father; Hyperlipidemia in her father; and Hypertension in her father. Allergies  Allergen Reactions  . Montelukast Sodium   . Propranolol Hcl   . Sumatriptan   . Vancomycin    Current Outpatient Prescriptions on File Prior to Visit  Medication Sig Dispense Refill  . albuterol (PROAIR HFA) 108 (90 BASE) MCG/ACT inhaler Inhale 2 puffs into the lungs 4 (four) times daily.        . diazepam (VALIUM) 2 MG tablet Take 2 mg by mouth at bedtime as needed.        Marland Kitchen esomeprazole (NEXIUM) 40 MG capsule Take 40 mg by mouth daily before breakfast.        . fexofenadine (ALLEGRA) 180 MG tablet Take 180 mg by mouth daily as needed.        . hydrochlorothiazide 25 MG tablet Take 25 mg by mouth daily.        . nitroGLYCERIN (NITRODUR - DOSED IN MG/24 HR) 0.2 mg/hr Place 1 patch onto the skin daily.        . potassium chloride (KLOR-CON) 10 MEQ CR tablet Take 10 mEq by mouth daily.        Marland Kitchen oxaprozin (DAYPRO) 600 MG tablet Take 1,200 mg by mouth daily.         Review of Systems Review of Systems  Constitutional: Negative for diaphoresis, activity change, appetite change and unexpected weight change.  HENT: Negative for hearing loss, ear pain, facial swelling, mouth sores and neck stiffness.   Eyes:  Negative for pain, redness and visual disturbance.  Respiratory: Negative for shortness of breath and wheezing.   Cardiovascular: Negative for chest pain and palpitations.  Gastrointestinal: Negative for diarrhea, blood in stool, abdominal distention and rectal pain.  Genitourinary: Negative for hematuria, flank pain and decreased urine volume.  Musculoskeletal: Negative for myalgias and joint swelling.  Skin: Negative for color change and wound.  Neurological: Negative for syncope and numbness.  Hematological: Negative for adenopathy.  Psychiatric/Behavioral: Negative for hallucinations, self-injury, decreased concentration and agitation.      Objective:   Physical  Exam BP 102/60  Pulse 66  Temp(Src) 98.4 F (36.9 C) (Oral)  Ht 5\' 7"  (1.702 m)  Wt 258 lb 2 oz (117.085 kg)  BMI 40.43 kg/m2  SpO2 96%  LMP 07/13/2011 Physical Exam  VS noted Constitutional: Pt is oriented to person, place, and time. Appears well-developed and well-nourished.  HENT:  Head: Normocephalic and atraumatic.  Right Ear: External ear normal.  Left Ear: External ear normal.  Nose: Nose normal.  Mouth/Throat: Oropharynx is clear and moist.  Eyes: Conjunctivae and EOM are normal. Pupils are equal, round, and reactive to light.  Neck: Normal range of motion. Neck supple. No JVD present. No tracheal deviation present.  Cardiovascular: Normal rate, regular rhythm, normal heart sounds and intact distal pulses.   Pulmonary/Chest: Effort normal and breath sounds normal.  Abdominal: Soft. Bowel sounds are normal. There is no tenderness.  Musculoskeletal: Normal range of motion. Exhibits no edema.  Lymphadenopathy:  Has no cervical adenopathy.  Neurological: Pt is alert and oriented to person, place, and time. Pt has normal reflexes. No cranial nerve deficit.  Skin: Skin is warm and dry. No rash noted.  Psychiatric:  Has  normal mood and affect. Behavior is normal.         Assessment & Plan:

## 2011-07-24 ENCOUNTER — Telehealth: Payer: Self-pay | Admitting: Internal Medicine

## 2011-07-24 MED ORDER — LEVOCETIRIZINE DIHYDROCHLORIDE 5 MG PO TABS: 5.0000 mg | ORAL_TABLET | Freq: Every evening | ORAL | Status: DC

## 2011-07-24 NOTE — Telephone Encounter (Signed)
Allegra 60 bid changed to xyzal per [facility]ist recommendation

## 2011-09-11 ENCOUNTER — Telehealth: Payer: Self-pay

## 2011-09-11 MED ORDER — CEPHALEXIN 500 MG PO CAPS: 500.0000 mg | ORAL_CAPSULE | Freq: Four times a day (QID) | ORAL | Status: AC

## 2011-09-11 MED ORDER — CEPHALEXIN 500 MG PO CAPS: 500.0000 mg | ORAL_CAPSULE | Freq: Four times a day (QID) | ORAL | Status: DC

## 2011-09-11 NOTE — Telephone Encounter (Signed)
Pt called stating she has been experiencing urinary frequency and urgency with mild lower back pain. Pt lives in [place] and is unable to make OV but is requesting Rx for ABX, please advise.

## 2011-09-11 NOTE — Telephone Encounter (Signed)
Pt advised and ABX resent to local pharmacy in [place]

## 2011-09-11 NOTE — Telephone Encounter (Signed)
Ok this time 

## 2011-10-06 ENCOUNTER — Other Ambulatory Visit: Payer: Self-pay | Admitting: Obstetrics and Gynecology

## 2012-06-02 ENCOUNTER — Telehealth: Payer: Self-pay | Admitting: Internal Medicine

## 2012-06-02 NOTE — Telephone Encounter (Signed)
Caller: Sue/Patient; PCP: Oliver Barre; CB#: [phone]; ; ; Call regarding Ear Pain;  Onset-05/30/12  Afebrile. Pt c/o of  left ear pain, puffines  and  congestion. States she always gets ear infections after having a cold and she is recovering from a cold. Pt is requesting antibiotic ear drops. She is out of town and went to a doctor there but was told to use OTC decongestant. Emergent s/s of Ear s/s protocol r/o. Pt to see provider within 24 hrs. Pt will not be back in town until next week and is requesting antibiotic ear drop rx. She states if rx will be prescribed, it can be sent to [facility] and they will transfer to  where she is.

## 2012-06-02 NOTE — Telephone Encounter (Signed)
Pt advised of office policy - no ABX without OV.

## 2012-07-21 ENCOUNTER — Encounter: Payer: 59 | Admitting: Internal Medicine

## 2012-10-11 ENCOUNTER — Other Ambulatory Visit: Payer: Self-pay | Admitting: Internal Medicine

## 2012-11-11 ENCOUNTER — Other Ambulatory Visit: Payer: Self-pay | Admitting: Internal Medicine

## 2012-12-16 ENCOUNTER — Ambulatory Visit: Payer: 59 | Admitting: Internal Medicine

## 2012-12-19 ENCOUNTER — Telehealth: Payer: Self-pay | Admitting: Internal Medicine

## 2012-12-19 NOTE — Telephone Encounter (Signed)
[facility] °Triage Call Report °Triage Record Num: [id number] Operator: Leigh [name] °Patient Name: Cassidy [name] Call Date & Time: 12/15/2012 4:30:24PM °Patient Phone: [phone] PCP: James John °Patient Gender: Female PCP Fax : [phone] °Patient DOB: 01/08/1971 Practice Name: [facility] °Reason for Call: °Caller: [name]/Patient; PCP: John, James (Adults only); CB#: [phone]; Call regarding °suppose to have appt in AM for refills and is not going to be able to make it/ office has °cancelled due to weather concerns. Has only enough HCTZ until Sat. She needs enough to °get her through until she can get another appt to have refilled. Call to oncall physician Dr. °James Johns. Auth received for refills of Nexium and HCTZ. Patient is not sure if she has °enough Albuterol until her next appt, this was refilled per standing order policy to "refill °enough for next business day." Called to [facility] # [phone]. Called °in the following : Nexium 40mg Tab 1 daily, #30, No refills, HCTZ 25mg Tab 1 daily; #30 °no refills, Albuterol HFA 108mcg/act, #1, No refills. Patient has no other questions or °concerns at this time. °Protocol(s) Used: Medication Questions - Adult °Recommended Outcome per Protocol: Call Dispensing Pharmacy or Provider Immediately °Reason for Outcome: Unable to obtain prescribed medication related to °available resources AND situation poses °immediate clinical risk °

## 2013-01-06 ENCOUNTER — Ambulatory Visit: Payer: 59 | Admitting: Internal Medicine

## 2013-01-13 ENCOUNTER — Ambulatory Visit (INDEPENDENT_AMBULATORY_CARE_PROVIDER_SITE_OTHER): Payer: No Typology Code available for payment source | Admitting: Internal Medicine

## 2013-01-13 ENCOUNTER — Other Ambulatory Visit (INDEPENDENT_AMBULATORY_CARE_PROVIDER_SITE_OTHER): Payer: No Typology Code available for payment source

## 2013-01-13 ENCOUNTER — Encounter: Payer: Self-pay | Admitting: Internal Medicine

## 2013-01-13 VITALS — BP 110/76 | HR 97 | Temp 98.6°F | Ht 67.5 in | Wt 274.5 lb

## 2013-01-13 DIAGNOSIS — Z0000 Encounter for general adult medical examination without abnormal findings: Secondary | ICD-10-CM

## 2013-01-13 DIAGNOSIS — G43009 Migraine without aura, not intractable, without status migrainosus: Secondary | ICD-10-CM

## 2013-01-13 LAB — HEPATIC FUNCTION PANEL
ALT: 35 U/L (ref 0–35)
AST: 27 U/L (ref 0–37)
Bilirubin, Direct: 0 mg/dL (ref 0.0–0.3)
Total Protein: 7.8 g/dL (ref 6.0–8.3)

## 2013-01-13 LAB — CBC WITH DIFFERENTIAL/PLATELET
Basophils Absolute: 0 10*3/uL (ref 0.0–0.1)
Basophils Relative: 0.4 % (ref 0.0–3.0)
Eosinophils Absolute: 0.2 10*3/uL (ref 0.0–0.7)
Lymphocytes Relative: 23.6 % (ref 12.0–46.0)
MCHC: 34 g/dL (ref 30.0–36.0)
Neutrophils Relative %: 70.3 % (ref 43.0–77.0)
Platelets: 390 10*3/uL (ref 150.0–400.0)
RBC: 4.91 Mil/uL (ref 3.87–5.11)

## 2013-01-13 LAB — URINALYSIS, ROUTINE W REFLEX MICROSCOPIC
Total Protein, Urine: NEGATIVE
Urine Glucose: 100
pH: 6 (ref 5.0–8.0)

## 2013-01-13 LAB — BASIC METABOLIC PANEL
BUN: 14 mg/dL (ref 6–23)
CO2: 28 mEq/L (ref 19–32)
Chloride: 101 mEq/L (ref 96–112)
Creatinine, Ser: 0.8 mg/dL (ref 0.4–1.2)

## 2013-01-13 LAB — LIPID PANEL
Cholesterol: 210 mg/dL — ABNORMAL HIGH (ref 0–200)
Total CHOL/HDL Ratio: 6

## 2013-01-13 LAB — LDL CHOLESTEROL, DIRECT: Direct LDL: 126.4 mg/dL

## 2013-01-13 MED ORDER — ALBUTEROL SULFATE HFA 108 (90 BASE) MCG/ACT IN AERS: 2.0000 | INHALATION_SPRAY | Freq: Four times a day (QID) | RESPIRATORY_TRACT | Status: DC

## 2013-01-13 MED ORDER — CETIRIZINE HCL 10 MG PO TABS: 10.0000 mg | ORAL_TABLET | Freq: Every day | ORAL | Status: DC

## 2013-01-13 MED ORDER — HYDROCHLOROTHIAZIDE 25 MG PO TABS: ORAL_TABLET | ORAL | Status: DC

## 2013-01-13 MED ORDER — DIAZEPAM 2 MG PO TABS: 2.0000 mg | ORAL_TABLET | Freq: Every evening | ORAL | Status: DC | PRN

## 2013-01-13 MED ORDER — CYCLOBENZAPRINE HCL 10 MG PO TABS: 10.0000 mg | ORAL_TABLET | Freq: Every day | ORAL | Status: DC | PRN

## 2013-01-13 MED ORDER — PANTOPRAZOLE SODIUM 40 MG PO TBEC: 40.0000 mg | DELAYED_RELEASE_TABLET | Freq: Every day | ORAL | Status: DC

## 2013-01-13 MED ORDER — PROMETHAZINE HCL 25 MG PO TABS: 25.0000 mg | ORAL_TABLET | Freq: Every day | ORAL | Status: DC | PRN

## 2013-01-13 MED ORDER — RIZATRIPTAN BENZOATE 10 MG PO TABS: 10.0000 mg | ORAL_TABLET | Freq: Every day | ORAL | Status: AC | PRN

## 2013-01-13 MED ORDER — POTASSIUM CHLORIDE ER 10 MEQ PO TBCR: 10.0000 meq | EXTENDED_RELEASE_TABLET | Freq: Every day | ORAL | Status: DC

## 2013-01-13 MED ORDER — MELOXICAM 7.5 MG PO TABS: 7.5000 mg | ORAL_TABLET | Freq: Every day | ORAL | Status: DC | PRN

## 2013-01-13 NOTE — Patient Instructions (Addendum)
Please take all new medication as prescribed - the generic protonix, and zyrtec instead of xyzal Please continue all other medications as before, and refills have been done if requested. Please have the pharmacy call with any other refills you may need. Please continue your efforts at being more active, low cholesterol diet, and weight control. You are otherwise up to date with prevention measures today. Please go to the LAB in the Basement (turn left off the elevator) for the tests to be done today You will be contacted by phone if any changes need to be made immediately.  Otherwise, you will receive a letter about your results with an explanation, but please check with MyChart first. Please remember to followup with your GYN for the yearly pap smear and/or mammogram as you do (Dr Milton Ferguson) Thank you for enrolling in MyChart. Please follow the instructions below to securely access your online medical record. MyChart allows you to send messages to your doctor, view your test results, renew your prescriptions, schedule appointments, and more. To Log into My Chart online, please go by [employer] or [employer] to [email or web address], or download the MyChart App from the [employer] of [employer] .  Your Charlyne Petrin is: [phone]  (pass [phone]) Please send a practice Message on Mychart later today. Please return in 1 year for your yearly visit, or sooner if needed, with Lab testing done 3-5 days before

## 2013-01-13 NOTE — Progress Notes (Signed)
Subjective:    Patient ID: Cassidy [name], female    DOB: 1971/10/22, 42 y.o.   MRN: [id number]  HPI  Here for wellness and f/u;  Overall doing ok;  Pt denies CP, worsening SOB, DOE, wheezing, orthopnea, PND, worsening LE edema, palpitations, dizziness or syncope.  Pt denies neurological change such as new headache, facial or extremity weakness.  Pt denies polydipsia, polyuria, or low sugar symptoms. Pt states overall good compliance with treatment and medications, good tolerability, and has been trying to follow lower cholesterol diet.  Pt denies worsening depressive symptoms, suicidal ideation or panic. No fever, night sweats, wt loss, loss of appetite, or other constitutional symptoms.  Pt states good ability with ADL's, has low fall risk, home safety reviewed and adequate, no other significant changes in hearing or vision, and only occasionally active with exercise. Gained wt last yr with 6 mo not working, but now back to work, lost 15lbs  Does have chronic pain to inner left ankle, for screw removal per Dr Lajoyce Corners April 4. Past Medical History  Diagnosis Date  . ANEMIA-IRON DEFICIENCY 09/20/2007  . ASTHMA 09/20/2007  . COMMON MIGRAINE 01/02/2009  . FEVER UNSPECIFIED 01/17/2008  . FROZEN RIGHT SHOULDER 07/10/2009  . GERD 01/02/2009  . GLUCOSE INTOLERANCE 01/02/2009  . Headache 09/20/2007  . HYPERTENSION 01/02/2009  . LATERAL EPICONDYLITIS, RIGHT 06/05/2009  . ROTATOR CUFF SYNDROME, RIGHT 05/27/2009  . SHOULDER PAIN, RIGHT 05/27/2009  . Impaired glucose tolerance 07/17/2011  . Hematuria 07/20/2011   Past Surgical History  Procedure Laterality Date  . Tubal ligation  1955  . S/p uterine ablation  2007  . S/p epicondylar release    . S/p leep procedure 2000 for abnormal pap      Normal since then    reports that she has never smoked. She does not have any smokeless tobacco history on file. She reports that she does not drink alcohol or use illicit drugs. family history includes Colon cancer in her paternal  grandfather; Diabetes in her father; Hyperlipidemia in her father; and Hypertension in her father. Allergies  Allergen Reactions  . Montelukast Sodium   . Propranolol Hcl   . Sumatriptan   . Vancomycin    Current Outpatient Prescriptions on File Prior to Visit  Medication Sig Dispense Refill  . albuterol (PROAIR HFA) 108 (90 BASE) MCG/ACT inhaler Inhale 2 puffs into the lungs 4 (four) times daily.  1 Inhaler  11  . diazepam (VALIUM) 2 MG tablet Take 1 tablet (2 mg total) by mouth at bedtime as needed.  30 tablet  5  . esomeprazole (NEXIUM) 40 MG capsule Take 1 capsule (40 mg total) by mouth daily before breakfast.  90 capsule  3  . hydrochlorothiazide (HYDRODIURIL) 25 MG tablet TAKE ONE TABLET BY MOUTH EVERY DAY  30 tablet  0  . potassium chloride (KLOR-CON) 10 MEQ CR tablet Take 1 tablet (10 mEq total) by mouth daily.  90 tablet  3  . levocetirizine (XYZAL) 5 MG tablet Take 1 tablet (5 mg total) by mouth every evening.  30 tablet  11   No current facility-administered medications on file prior to visit.   Review of Systems Constitutional: Negative for diaphoresis, activity change, appetite change or unexpected weight change.  HENT: Negative for hearing loss, ear pain, facial swelling, mouth sores and neck stiffness.   Eyes: Negative for pain, redness and visual disturbance.  Respiratory: Negative for shortness of breath and wheezing.   Cardiovascular: Negative for chest pain and palpitations.  Gastrointestinal: Negative for diarrhea, blood in stool, abdominal distention or other pain Genitourinary: Negative for hematuria, flank pain or change in urine volume.  Musculoskeletal: Negative for myalgias and joint swelling.  Skin: Negative for color change and wound.  Neurological: Negative for syncope and numbness. other than noted Hematological: Negative for adenopathy.  Psychiatric/Behavioral: Negative for hallucinations, self-injury, decreased concentration and agitation.       Objective:   Physical Exam BP 110/76  Pulse 97  Temp(Src) 98.6 F (37 C) (Oral)  Ht 5' 7.5" (1.715 m)  Wt 274 lb 8 oz (124.512 kg)  BMI 42.33 kg/m2  SpO2 95% VS noted,  Constitutional: Pt is oriented to person, place, and time. Appears well-developed and well-nourished.  Head: Normocephalic and atraumatic.  Right Ear: External ear normal.  Left Ear: External ear normal.  Nose: Nose normal.  Mouth/Throat: Oropharynx is clear and moist.  Eyes: Conjunctivae and EOM are normal. Pupils are equal, round, and reactive to light.  Neck: Normal range of motion. Neck supple. No JVD present. No tracheal deviation present.  Cardiovascular: Normal rate, regular rhythm, normal heart sounds and intact distal pulses.   Pulmonary/Chest: Effort normal and breath sounds normal.  Abdominal: Soft. Bowel sounds are normal. There is no tenderness. No HSM  Musculoskeletal: Normal range of motion. Exhibits no edema.  Lymphadenopathy:  Has no cervical adenopathy.  Neurological: Pt is alert and oriented to person, place, and time. Pt has normal reflexes. No cranial nerve deficit.  Skin: Skin is warm and dry. No rash noted.  Psychiatric:  Has  normal mood and affect. Behavior is normal.     Assessment & Plan:

## 2013-01-14 ENCOUNTER — Encounter: Payer: Self-pay | Admitting: Internal Medicine

## 2013-01-15 ENCOUNTER — Other Ambulatory Visit: Payer: Self-pay | Admitting: Internal Medicine

## 2013-01-15 ENCOUNTER — Encounter: Payer: Self-pay | Admitting: Internal Medicine

## 2013-01-15 DIAGNOSIS — E785 Hyperlipidemia, unspecified: Secondary | ICD-10-CM

## 2013-01-15 HISTORY — DX: Hyperlipidemia, unspecified: E78.5

## 2013-01-15 MED ORDER — ALBUTEROL SULFATE HFA 108 (90 BASE) MCG/ACT IN AERS: 2.0000 | INHALATION_SPRAY | Freq: Four times a day (QID) | RESPIRATORY_TRACT | Status: DC | PRN

## 2013-01-16 ENCOUNTER — Telehealth: Payer: Self-pay

## 2013-01-16 DIAGNOSIS — Z0000 Encounter for general adult medical examination without abnormal findings: Secondary | ICD-10-CM

## 2013-01-16 NOTE — Telephone Encounter (Signed)
Ordered lab

## 2013-01-23 ENCOUNTER — Telehealth: Payer: Self-pay

## 2013-01-23 ENCOUNTER — Encounter: Payer: Self-pay | Admitting: Internal Medicine

## 2013-01-23 MED ORDER — ALBUTEROL SULFATE HFA 108 (90 BASE) MCG/ACT IN AERS: 2.0000 | INHALATION_SPRAY | Freq: Four times a day (QID) | RESPIRATORY_TRACT | Status: AC | PRN

## 2013-01-23 NOTE — Telephone Encounter (Signed)
Patient would like his proair changed to Ventolin which is covered by insurance. [employer] Cassidy [name].

## 2013-01-23 NOTE — Telephone Encounter (Signed)
rx sent in 

## 2013-01-23 NOTE — Telephone Encounter (Signed)
Ok to robin to change and refill (same inscructions per usual refill policy)

## 2013-01-25 ENCOUNTER — Telehealth: Payer: Self-pay | Admitting: Internal Medicine

## 2013-01-25 ENCOUNTER — Encounter: Payer: Self-pay | Admitting: Internal Medicine

## 2013-01-25 ENCOUNTER — Ambulatory Visit (INDEPENDENT_AMBULATORY_CARE_PROVIDER_SITE_OTHER): Payer: No Typology Code available for payment source | Admitting: Internal Medicine

## 2013-01-25 VITALS — BP 120/70 | HR 89 | Temp 97.5°F | Wt 277.4 lb

## 2013-01-25 DIAGNOSIS — R7302 Impaired glucose tolerance (oral): Secondary | ICD-10-CM

## 2013-01-25 DIAGNOSIS — R7309 Other abnormal glucose: Secondary | ICD-10-CM

## 2013-01-25 DIAGNOSIS — R42 Dizziness and giddiness: Secondary | ICD-10-CM | POA: Insufficient documentation

## 2013-01-25 DIAGNOSIS — J019 Acute sinusitis, unspecified: Secondary | ICD-10-CM | POA: Insufficient documentation

## 2013-01-25 DIAGNOSIS — J45901 Unspecified asthma with (acute) exacerbation: Secondary | ICD-10-CM | POA: Insufficient documentation

## 2013-01-25 MED ORDER — LEVOFLOXACIN 250 MG PO TABS: 250.0000 mg | ORAL_TABLET | Freq: Every day | ORAL | Status: DC

## 2013-01-25 MED ORDER — PREDNISONE 10 MG PO TABS: ORAL_TABLET | ORAL | Status: DC

## 2013-01-25 MED ORDER — HYDROCODONE-HOMATROPINE 5-1.5 MG/5ML PO SYRP: 5.0000 mL | ORAL_SOLUTION | Freq: Four times a day (QID) | ORAL | Status: DC | PRN

## 2013-01-25 MED ORDER — METHYLPREDNISOLONE ACETATE 80 MG/ML IJ SUSP
80.0000 mg | Freq: Once | INTRAMUSCULAR | Status: AC
  Administered 2013-01-25: 80 mg via INTRAMUSCULAR

## 2013-01-25 MED ORDER — MECLIZINE HCL 12.5 MG PO TABS: 12.5000 mg | ORAL_TABLET | Freq: Three times a day (TID) | ORAL | Status: DC | PRN

## 2013-01-25 NOTE — Assessment & Plan Note (Signed)
Mild, likely related to current illness, for meclizine prn

## 2013-01-25 NOTE — Assessment & Plan Note (Signed)
stable overall by history and exam, recent data reviewed with pt, and pt to continue medical treatment as before,  to f/u any worsening symptoms or concerns Lab Results  Component Value Date   HGBA1C 5.7 07/03/2009   Pt to call for onset polys or cbg > 200 with steroid tx

## 2013-01-25 NOTE — Assessment & Plan Note (Signed)
Mild, for depomedrol IM, and predpack course,  to f/u any worsening symptoms or concerns

## 2013-01-25 NOTE — Assessment & Plan Note (Signed)
Mild to mod, for antibx course,  to f/u any worsening symptoms or concerns 

## 2013-01-25 NOTE — Progress Notes (Signed)
Subjective:    Patient ID: Cassidy [name], female    DOB: September 30, 1971, 42 y.o.   MRN: [id number]  HPI   Here with 2-3 days acute onset fever, facial pain, pressure, headache, general weakness and malaise, and greenish d/c, with mild ST and cough, but pt denies chest pain, wheezing, increased sob or doe, orthopnea, PND, increased LE swelling, palpitations, dizziness or syncope, except for onset mild wheeezing/sob starting last night. Also with onset vertigo with position change several times lasting less than 30 sec each time for last 2 days, with right eaer fullness.   Past Medical History  Diagnosis Date  . ANEMIA-IRON DEFICIENCY 09/20/2007  . ASTHMA 09/20/2007  . COMMON MIGRAINE 01/02/2009  . FEVER UNSPECIFIED 01/17/2008  . FROZEN RIGHT SHOULDER 07/10/2009  . GERD 01/02/2009  . GLUCOSE INTOLERANCE 01/02/2009  . Headache 09/20/2007  . HYPERTENSION 01/02/2009  . LATERAL EPICONDYLITIS, RIGHT 06/05/2009  . ROTATOR CUFF SYNDROME, RIGHT 05/27/2009  . SHOULDER PAIN, RIGHT 05/27/2009  . Impaired glucose tolerance 07/17/2011  . Hematuria 07/20/2011  . Other and unspecified hyperlipidemia 01/15/2013   Past Surgical History  Procedure Laterality Date  . Tubal ligation  1955  . S/p uterine ablation  2007  . S/p epicondylar release    . S/p leep procedure 2000 for abnormal pap      Normal since then    reports that she has never smoked. She does not have any smokeless tobacco history on file. She reports that she does not drink alcohol or use illicit drugs. family history includes Colon cancer in her paternal grandfather; Diabetes in her father; Hyperlipidemia in her father; and Hypertension in her father. Allergies  Allergen Reactions  . Montelukast Sodium   . Propranolol Hcl   . Sumatriptan   . Vancomycin    Current Outpatient Prescriptions on File Prior to Visit  Medication Sig Dispense Refill  . albuterol (PROVENTIL HFA;VENTOLIN HFA) 108 (90 BASE) MCG/ACT inhaler Inhale 2 puffs into the lungs every 6  (six) hours as needed for wheezing.  3 Inhaler  3  . cyclobenzaprine (FLEXERIL) 10 MG tablet Take 1 tablet (10 mg total) by mouth daily as needed for muscle spasms. Take as needed for tension headaches  90 tablet  1  . diazepam (VALIUM) 2 MG tablet Take 1 tablet (2 mg total) by mouth at bedtime as needed.  30 tablet  5  . hydrochlorothiazide (HYDRODIURIL) 25 MG tablet TAKE ONE TABLET BY MOUTH EVERY DAY  90 tablet  3  . meloxicam (MOBIC) 7.5 MG tablet Take 1 tablet (7.5 mg total) by mouth daily as needed for pain. 1 by mouth as needed for headaches.  90 tablet  1  . pantoprazole (PROTONIX) 40 MG tablet Take 1 tablet (40 mg total) by mouth daily.  90 tablet  3  . potassium chloride (KLOR-CON 10) 10 MEQ tablet Take 1 tablet (10 mEq total) by mouth daily.  90 tablet  3  . promethazine (PHENERGAN) 25 MG tablet Take 1 tablet (25 mg total) by mouth daily as needed for nausea.  30 tablet  3  . rizatriptan (MAXALT) 10 MG tablet Take 1 tablet (10 mg total) by mouth daily as needed for migraine. 1 tablet by mouth for Migraine.  May repeat in 2 hours (no more than 2 in 24 hours or 2-3 days a week).  10 tablet  5  . levocetirizine (XYZAL) 5 MG tablet Take 1 tablet (5 mg total) by mouth every evening.  30 tablet  11  No current facility-administered medications on file prior to visit.   Review of Systems  Constitutional: Negative for unexpected weight change, or unusual diaphoresis  HENT: Negative for tinnitus.   Eyes: Negative for photophobia and visual disturbance.  Respiratory: Negative for choking and stridor.   Gastrointestinal: Negative for vomiting and blood in stool.  Genitourinary: Negative for hematuria and decreased urine volume.  Musculoskeletal: Negative for acute joint swelling Skin: Negative for color change and wound.  Neurological: Negative for tremors and numbness other than noted  Psychiatric/Behavioral: Negative for decreased concentration or  hyperactivity.       Objective:    Physical Exam BP 120/70  Pulse 89  Temp(Src) 97.5 F (36.4 C) (Oral)  Wt 277 lb 6 oz (125.816 kg)  BMI 42.78 kg/m2  SpO2 98% VS noted, mild ill Constitutional: Pt appears well-developed and well-nourished.  HENT: Head: NCAT.  Right Ear: External ear normal.  Left Ear: External ear normal.  Eyes: Conjunctivae and EOM are normal. Pupils are equal, round, and reactive to light.  Bilat tm's with mild erythema.  Max sinus areas mild tender.  Pharynx with mild erythema, no exudate Neck: Normal range of motion. Neck supple.  Cardiovascular: Normal rate and regular rhythm.   Pulmonary/Chest: Effort normal and breath sounds mild decreased with few wheeze bilat  Neurological: Pt is alert. Not confused  Skin: Skin is warm. No erythema.  Psychiatric: Pt behavior is normal. Thought content normal.     Assessment & Plan:

## 2013-01-25 NOTE — Patient Instructions (Signed)
You had the steroid shot today Please take all new medication as prescribed - the antibiotic, cough medicine, prednisone, and meclizine (for the vertigo if needed) Please continue all other medications as before Thank you for enrolling in MyChart. Please follow the instructions below to securely access your online medical record. MyChart allows you to send messages to your doctor, view your test results, renew your prescriptions, schedule appointments, and more

## 2013-01-25 NOTE — Telephone Encounter (Signed)
Patient Information:  Caller Name: Adelle  Phone: [phone]  Patient: Cassidy [name], Cassidy [name]  Gender: Female  DOB: [birth year]  Age: 42 Years  PCP: Oliver Barre (Adults only)  Pregnant: No  Office Follow Up:  Does the office need to follow up with this patient?: No  Instructions For The Office: N/A  RN Note:  Has had recent head cold sxs but she said starting yesterday she began getting very dizzy.  She said when it hits she has to hang on to something or lean against the wall.  No deficits or chest pain. is drinking well.  She is not having sxs at this time  she said they are occuring sometimes twice an hour  Symptoms  Reason For Call & Symptoms: head cold sxs  with vertigo  Reviewed Health History In EMR: Yes  Reviewed Medications In EMR: Yes  Reviewed Allergies In EMR: Yes  Reviewed Surgeries / Procedures: Yes  Date of Onset of Symptoms: 01/20/2013 OB / GYN:  LMP: Unknown  Guideline(s) Used:  Dizziness  Disposition Per Guideline:   See Today in Office  Reason For Disposition Reached:   Patient wants to be seen  Advice Given:  Call Back If:  You become worse.  Patient Will Follow Care Advice:  YES  Appointment Scheduled:  01/25/2013 16:00:00 Appointment Scheduled Provider:  Oliver Barre (Adults only)

## 2013-03-03 ENCOUNTER — Encounter: Payer: Self-pay | Admitting: Internal Medicine

## 2013-03-04 ENCOUNTER — Encounter: Payer: Self-pay | Admitting: Internal Medicine

## 2013-03-04 MED ORDER — FEXOFENADINE HCL 180 MG PO TABS: 180.0000 mg | ORAL_TABLET | Freq: Every day | ORAL | Status: DC | PRN

## 2013-03-06 MED ORDER — FEXOFENADINE HCL 180 MG PO TABS: 180.0000 mg | ORAL_TABLET | Freq: Every day | ORAL | Status: DC | PRN

## 2013-03-08 ENCOUNTER — Encounter (HOSPITAL_COMMUNITY): Payer: Self-pay | Admitting: Pharmacist

## 2013-03-09 ENCOUNTER — Encounter (HOSPITAL_COMMUNITY): Payer: Self-pay | Admitting: *Deleted

## 2013-03-09 MED ORDER — MUPIROCIN 2 % EX OINT
TOPICAL_OINTMENT | Freq: Once | CUTANEOUS | Status: DC
  Filled 2013-03-09: qty 22

## 2013-03-09 NOTE — Progress Notes (Signed)
Pt denies SOB, chest pain, and being under the care of a cardiologist. Pt states that" when I have palpitations, I know that I need to eat a banana and take my Potassium pills." Pt states that her doctor didn't feel that she needed to see a cardiologist. Pt states that she had a Sleep Study done at [facility]

## 2013-03-10 ENCOUNTER — Encounter (HOSPITAL_COMMUNITY): Payer: Self-pay | Admitting: Anesthesiology

## 2013-03-10 ENCOUNTER — Ambulatory Visit (HOSPITAL_COMMUNITY)
Admission: RE | Admit: 2013-03-10 | Discharge: 2013-03-10 | Disposition: A | Discharge status: Home / Self Care | Payer: No Typology Code available for payment source | Source: Ambulatory Visit | Attending: Orthopedic Surgery | Admitting: Orthopedic Surgery

## 2013-03-10 ENCOUNTER — Ambulatory Visit (HOSPITAL_COMMUNITY): Payer: No Typology Code available for payment source

## 2013-03-10 ENCOUNTER — Encounter (HOSPITAL_COMMUNITY)
Admission: RE | Disposition: A | Discharge status: Home / Self Care | Payer: Self-pay | Source: Ambulatory Visit | Attending: Orthopedic Surgery

## 2013-03-10 ENCOUNTER — Ambulatory Visit (HOSPITAL_COMMUNITY): Payer: No Typology Code available for payment source | Admitting: Anesthesiology

## 2013-03-10 DIAGNOSIS — J45909 Unspecified asthma, uncomplicated: Secondary | ICD-10-CM | POA: Insufficient documentation

## 2013-03-10 DIAGNOSIS — K219 Gastro-esophageal reflux disease without esophagitis: Secondary | ICD-10-CM | POA: Insufficient documentation

## 2013-03-10 DIAGNOSIS — T8489XA Other specified complication of internal orthopedic prosthetic devices, implants and grafts, initial encounter: Secondary | ICD-10-CM | POA: Insufficient documentation

## 2013-03-10 DIAGNOSIS — Y831 Surgical operation with implant of artificial internal device as the cause of abnormal reaction of the patient, or of later complication, without mention of misadventure at the time of the procedure: Secondary | ICD-10-CM | POA: Insufficient documentation

## 2013-03-10 DIAGNOSIS — T8484XA Pain due to internal orthopedic prosthetic devices, implants and grafts, initial encounter: Secondary | ICD-10-CM

## 2013-03-10 DIAGNOSIS — I10 Essential (primary) hypertension: Secondary | ICD-10-CM | POA: Insufficient documentation

## 2013-03-10 HISTORY — DX: Other specified postprocedural states: Z98.890

## 2013-03-10 HISTORY — DX: Palpitations: R00.2

## 2013-03-10 HISTORY — PX: HARDWARE REMOVAL: SHX979

## 2013-03-10 HISTORY — DX: Other specified postprocedural states: R11.2

## 2013-03-10 LAB — BASIC METABOLIC PANEL
BUN: 15 mg/dL (ref 6–23)
CO2: 29 mEq/L (ref 19–32)
Chloride: 100 mEq/L (ref 96–112)
Creatinine, Ser: 0.81 mg/dL (ref 0.50–1.10)
Glucose, Bld: 112 mg/dL — ABNORMAL HIGH (ref 70–99)

## 2013-03-10 LAB — CBC
HCT: 38.8 % (ref 36.0–46.0)
Hemoglobin: 13.3 g/dL (ref 12.0–15.0)
MCV: 82.9 fL (ref 78.0–100.0)
Platelets: 368 10*3/uL (ref 150–400)
RBC: 4.68 MIL/uL (ref 3.87–5.11)
WBC: 11.6 10*3/uL — ABNORMAL HIGH (ref 4.0–10.5)

## 2013-03-10 LAB — HCG, SERUM, QUALITATIVE: Preg, Serum: NEGATIVE

## 2013-03-10 SURGERY — REMOVAL, HARDWARE
Anesthesia: General | Site: Ankle | Laterality: Left

## 2013-03-10 MED ORDER — ONDANSETRON HCL 4 MG/2ML IJ SOLN
INTRAMUSCULAR | Status: DC | PRN
  Administered 2013-03-10: 4 mg via INTRAVENOUS

## 2013-03-10 MED ORDER — DEXAMETHASONE SODIUM PHOSPHATE 10 MG/ML IJ SOLN
INTRAMUSCULAR | Status: DC | PRN
  Administered 2013-03-10: 4 mg via INTRAVENOUS

## 2013-03-10 MED ORDER — HYDROCODONE-ACETAMINOPHEN 5-325 MG PO TABS: 1.0000 | ORAL_TABLET | Freq: Four times a day (QID) | ORAL | Status: DC | PRN

## 2013-03-10 MED ORDER — BUPIVACAINE HCL (PF) 0.5 % IJ SOLN
INTRAMUSCULAR | Status: AC
  Filled 2013-03-10: qty 30

## 2013-03-10 MED ORDER — LIDOCAINE HCL (CARDIAC) 20 MG/ML IV SOLN
INTRAVENOUS | Status: DC | PRN
  Administered 2013-03-10: 80 mg via INTRAVENOUS

## 2013-03-10 MED ORDER — HYDROCODONE-ACETAMINOPHEN 5-325 MG PO TABS
2.0000 | ORAL_TABLET | Freq: Once | ORAL | Status: AC
  Administered 2013-03-10: 2 via ORAL
  Filled 2013-03-10: qty 2

## 2013-03-10 MED ORDER — ONDANSETRON HCL 4 MG/2ML IJ SOLN: 4.0000 mg | Freq: Once | INTRAMUSCULAR | Status: DC | PRN

## 2013-03-10 MED ORDER — MIDAZOLAM HCL 5 MG/5ML IJ SOLN
INTRAMUSCULAR | Status: DC | PRN
  Administered 2013-03-10: 2 mg via INTRAVENOUS

## 2013-03-10 MED ORDER — CEFAZOLIN SODIUM 1-5 GM-% IV SOLN
INTRAVENOUS | Status: AC
  Filled 2013-03-10: qty 100

## 2013-03-10 MED ORDER — CEFAZOLIN SODIUM-DEXTROSE 2-3 GM-% IV SOLR
INTRAVENOUS | Status: AC
  Administered 2013-03-10: 2 g via INTRAVENOUS
  Filled 2013-03-10: qty 50

## 2013-03-10 MED ORDER — PROPOFOL 10 MG/ML IV EMUL
INTRAVENOUS | Status: DC | PRN
  Administered 2013-03-10: 250 mL via INTRAVENOUS

## 2013-03-10 MED ORDER — FENTANYL CITRATE 0.05 MG/ML IJ SOLN
INTRAMUSCULAR | Status: DC | PRN
  Administered 2013-03-10 (×2): 50 ug via INTRAVENOUS

## 2013-03-10 MED ORDER — HYDROMORPHONE HCL PF 1 MG/ML IJ SOLN: 0.2500 mg | INTRAMUSCULAR | Status: DC | PRN

## 2013-03-10 MED ORDER — 0.9 % SODIUM CHLORIDE (POUR BTL) OPTIME
TOPICAL | Status: DC | PRN
  Administered 2013-03-10: 1000 mL

## 2013-03-10 MED ORDER — LACTATED RINGERS IV SOLN
INTRAVENOUS | Status: DC
  Administered 2013-03-10: 11:00:00 via INTRAVENOUS

## 2013-03-10 MED ORDER — BUPIVACAINE HCL (PF) 0.5 % IJ SOLN
INTRAMUSCULAR | Status: DC | PRN
  Administered 2013-03-10: 30 mL

## 2013-03-10 SURGICAL SUPPLY — 51 items
BANDAGE ELASTIC 4 VELCRO ST LF (GAUZE/BANDAGES/DRESSINGS) IMPLANT
BANDAGE ELASTIC 6 VELCRO ST LF (GAUZE/BANDAGES/DRESSINGS) IMPLANT
BANDAGE ESMARK 6X9 LF (GAUZE/BANDAGES/DRESSINGS) IMPLANT
BANDAGE GAUZE ELAST BULKY 4 IN (GAUZE/BANDAGES/DRESSINGS) ×2 IMPLANT
BNDG COHESIVE 4X5 TAN STRL (GAUZE/BANDAGES/DRESSINGS) ×2 IMPLANT
BNDG ESMARK 6X9 LF (GAUZE/BANDAGES/DRESSINGS)
CLOTH BEACON ORANGE TIMEOUT ST (SAFETY) ×2 IMPLANT
COVER SURGICAL LIGHT HANDLE (MISCELLANEOUS) ×2 IMPLANT
CUFF TOURNIQUET SINGLE 34IN LL (TOURNIQUET CUFF) IMPLANT
CUFF TOURNIQUET SINGLE 44IN (TOURNIQUET CUFF) IMPLANT
DRAPE C-ARM 42X72 X-RAY (DRAPES) IMPLANT
DRAPE INCISE IOBAN 66X45 STRL (DRAPES) IMPLANT
DRAPE ORTHO SPLIT 77X108 STRL (DRAPES)
DRAPE SURG ORHT 6 SPLT 77X108 (DRAPES) IMPLANT
DRSG EMULSION OIL 3X3 NADH (GAUZE/BANDAGES/DRESSINGS) ×2 IMPLANT
DRSG PAD ABDOMINAL 8X10 ST (GAUZE/BANDAGES/DRESSINGS) ×2 IMPLANT
ELECT REM PT RETURN 9FT ADLT (ELECTROSURGICAL) ×2
ELECTRODE REM PT RTRN 9FT ADLT (ELECTROSURGICAL) ×1 IMPLANT
GLOVE BIOGEL PI IND STRL 6 (GLOVE) ×1 IMPLANT
GLOVE BIOGEL PI IND STRL 8 (GLOVE) ×1 IMPLANT
GLOVE BIOGEL PI IND STRL 9 (GLOVE) ×1 IMPLANT
GLOVE BIOGEL PI INDICATOR 6 (GLOVE) ×1
GLOVE BIOGEL PI INDICATOR 8 (GLOVE) ×1
GLOVE BIOGEL PI INDICATOR 9 (GLOVE) ×1
GLOVE SS N UNI LF 8.0 STRL (GLOVE) ×2 IMPLANT
GLOVE SURG ORTHO 9.0 STRL STRW (GLOVE) ×2 IMPLANT
GOWN PREVENTION PLUS XLARGE (GOWN DISPOSABLE) ×4 IMPLANT
GOWN SRG XL XLNG 56XLVL 4 (GOWN DISPOSABLE) ×2 IMPLANT
GOWN STRL NON-REIN XL XLG LVL4 (GOWN DISPOSABLE) ×2
KIT BASIN OR (CUSTOM PROCEDURE TRAY) ×2 IMPLANT
KIT ROOM TURNOVER OR (KITS) ×2 IMPLANT
MANIFOLD NEPTUNE II (INSTRUMENTS) ×2 IMPLANT
NEEDLE HYPO 25GX1X1/2 BEV (NEEDLE) ×2 IMPLANT
NS IRRIG 1000ML POUR BTL (IV SOLUTION) ×2 IMPLANT
PACK ORTHO EXTREMITY (CUSTOM PROCEDURE TRAY) ×2 IMPLANT
PAD ARMBOARD 7.5X6 YLW CONV (MISCELLANEOUS) ×4 IMPLANT
PAD CAST 4YDX4 CTTN HI CHSV (CAST SUPPLIES) ×1 IMPLANT
PADDING CAST COTTON 4X4 STRL (CAST SUPPLIES) ×1
SPONGE GAUZE 4X4 12PLY (GAUZE/BANDAGES/DRESSINGS) ×2 IMPLANT
SPONGE LAP 18X18 X RAY DECT (DISPOSABLE) ×2 IMPLANT
STAPLER VISISTAT 35W (STAPLE) IMPLANT
STOCKINETTE IMPERVIOUS 9X36 MD (GAUZE/BANDAGES/DRESSINGS) IMPLANT
SUT ETHILON 2 0 PSLX (SUTURE) ×2 IMPLANT
SUT VIC AB 0 CT1 27 (SUTURE) ×1
SUT VIC AB 0 CT1 27XBRD ANBCTR (SUTURE) ×1 IMPLANT
SUT VIC AB 2-0 CT1 27 (SUTURE)
SUT VIC AB 2-0 CT1 TAPERPNT 27 (SUTURE) IMPLANT
SYR CONTROL 10ML LL (SYRINGE) ×2 IMPLANT
TOWEL OR 17X24 6PK STRL BLUE (TOWEL DISPOSABLE) ×2 IMPLANT
TOWEL OR 17X26 10 PK STRL BLUE (TOWEL DISPOSABLE) ×2 IMPLANT
WATER STERILE IRR 1000ML POUR (IV SOLUTION) ×2 IMPLANT

## 2013-03-10 NOTE — Progress Notes (Signed)
Patient informed Nurse that she needed something for pain. Patient stated "it's starting to hurt like hell.......can you call and see if I can have something before I leave because I have a hour drive home?" Nurse called Dr. Lajoyce Corners and informed him of this. Dr. Lajoyce Corners stated that patient could have one or two 5 mg Hydrocodone. Patient stated that she needed two. Orders entered into EPIC. Will administer and reevaluate pain after administration.

## 2013-03-10 NOTE — Anesthesia Procedure Notes (Signed)
Procedure Name: LMA Insertion Date/Time: 03/10/2013 11:34 AM Performed by: Gayla Medicus Pre-anesthesia Checklist: Patient identified, Emergency Drugs available, Suction available, Patient being monitored and Timeout performed Patient Re-evaluated:Patient Re-evaluated prior to inductionOxygen Delivery Method: Circle system utilized Preoxygenation: Pre-oxygenation with 100% oxygen Intubation Type: IV induction LMA: LMA with gastric port inserted LMA Size: 4.0 Number of attempts: 1 Placement Confirmation: positive ETCO2 and breath sounds checked- equal and bilateral Tube secured with: Tape Dental Injury: Teeth and Oropharynx as per pre-operative assessment

## 2013-03-10 NOTE — Op Note (Signed)
OPERATIVE REPORT  DATE OF SURGERY: 03/10/2013  PATIENT:  Cassidy [name],  42 y.o. female  PRE-OPERATIVE DIAGNOSIS:  Painful Retained Hardware Left Ankle  POST-OPERATIVE DIAGNOSIS:  Painful Retained Hardware Left Ankle  PROCEDURE:  Procedure(s): HARDWARE REMOVAL  SURGEON:  Surgeon(s): Nadara Mustard, MD  ANESTHESIA:   general  EBL:  min ML  SPECIMEN:  No Specimen  TOURNIQUET:  * No tourniquets in log *  PROCEDURE DETAILS: Patient is a 42 year old woman who is status post open reduction and total fixation left ankle fracture. She has pain with the medial deep retained hardware and presents at this time for removal of deep retained hardware. Risks and benefits were discussed including persistent pain infection need for additional surgery. Patient states she understands and wished to proceed at this time. Description of procedure patient brought to the operating room and underwent general anesthetic. After adequate levels of anesthesia were obtained patient's left lower extremity was prepped using DuraPrep draped in the sterile field. An incision was made over the previous hardware dissection was carried down to the screws. The screw heads were stripped and the screws were removed with screw removal instruments. The screws were removed without complications. The wound was irrigated with normal saline. The incision was closed using 2-0 nylon. The wound was covered with Adaptic orthopedic sponges ABDs Kerlix and Coban. Patient was extubated taken to the PACU in stable condition.  PLAN OF CARE: Discharge to home after PACU  PATIENT DISPOSITION:  PACU - hemodynamically stable.   Nadara Mustard, MD 03/10/2013 12:09 PM

## 2013-03-10 NOTE — Transfer of Care (Signed)
Immediate Anesthesia Transfer of Care Note  Patient: Cassidy [name]  Procedure(s) Performed: Procedure(s) with comments: HARDWARE REMOVAL (Left) - Left Ankle Removal Medial Malleolus Screws  Patient Location: PACU  Anesthesia Type:General  Level of Consciousness: awake, alert , oriented and patient cooperative  Airway & Oxygen Therapy: Patient Spontanous Breathing and Patient connected to nasal cannula oxygen  Post-op Assessment: Report given to PACU RN and Post -op Vital signs reviewed and stable  Post vital signs: Reviewed and stable  Complications: No apparent anesthesia complications

## 2013-03-10 NOTE — Preoperative (Signed)
Beta Blockers   Reason not to administer Beta Blockers:Not Applicable 

## 2013-03-10 NOTE — Progress Notes (Signed)
Patient informed Nurse that pain was better and rated it as "2" out of 10. Patient preparing for for discharge.

## 2013-03-10 NOTE — Anesthesia Preprocedure Evaluation (Addendum)
Anesthesia Evaluation  Patient identified by MRN, date of birth, ID band Patient awake    Reviewed: Allergy & Precautions, H&P , NPO status , Patient's Chart, lab work & pertinent test results  History of Anesthesia Complications (+) PONV  Airway Mallampati: II TM Distance: >3 FB Neck ROM: Full    Dental  (+) Teeth Intact and Dental Advisory Given   Pulmonary asthma ,  03-10-13 Chest Findings: The lungs are well-aerated and free from pulmonary edema, focal airspace consolidation or pulmonary nodule.  Trace central bronchitic changes stable compared to prior.  Cardiac and mediastinal contours are within normal limits.  No pneumothorax, or pleural effusion. No acute osseous findings.   IMPRESSION:   No acute cardiopulmonary disease.      Pulmonary exam normal       Cardiovascular Exercise Tolerance: Good hypertension, Pt. on medications Rhythm:Regular Rate:Normal  10-Mar-2013 09:01:38 [facility] ROUTINE RECORD Normal sinus rhythm Cannot rule out Anterior infarct , age undetermined   Neuro/Psych  Headaches,    GI/Hepatic Neg liver ROS, GERD-  Medicated and Controlled,  Endo/Other  negative endocrine ROSMorbid obesity  Renal/GU negative Renal ROS     Musculoskeletal  (+) Arthritis -, Osteoarthritis,    Abdominal Normal abdominal exam  (+)   Peds  Hematology   Anesthesia Other Findings   Reproductive/Obstetrics negative OB ROS                       Anesthesia Physical Anesthesia Plan  ASA: II  Anesthesia Plan: General   Post-op Pain Management:    Induction: Intravenous  Airway Management Planned: LMA  Additional Equipment:   Intra-op Plan:   Post-operative Plan: Extubation in OR  Informed Consent: I have reviewed the patients History and Physical, chart, labs and discussed the procedure including the risks, benefits and alternatives for the proposed  anesthesia with the patient or authorized representative who has indicated his/her understanding and acceptance.   Dental advisory given  Plan Discussed with: CRNA, Anesthesiologist and Surgeon  Anesthesia Plan Comments:        Anesthesia Quick Evaluation

## 2013-03-10 NOTE — H&P (Signed)
Cassidy [name] is an 42 y.o. female.   Chief Complaint: Painful deep retained hardware left ankle medial malleolus HPI: Patient is status post open reduction internal fixation of the left ankle. She has painful retained hardware and presents at this time for removal.  Past Medical History  Diagnosis Date  . ANEMIA-IRON DEFICIENCY 09/20/2007  . ASTHMA 09/20/2007  . COMMON MIGRAINE 01/02/2009  . FEVER UNSPECIFIED 01/17/2008  . FROZEN RIGHT SHOULDER 07/10/2009  . GERD 01/02/2009  . GLUCOSE INTOLERANCE 01/02/2009  . Headache 09/20/2007  . HYPERTENSION 01/02/2009  . LATERAL EPICONDYLITIS, RIGHT 06/05/2009  . ROTATOR CUFF SYNDROME, RIGHT 05/27/2009  . SHOULDER PAIN, RIGHT 05/27/2009  . Impaired glucose tolerance 07/17/2011  . Hematuria 07/20/2011  . Other and unspecified hyperlipidemia 01/15/2013  . PONV (postoperative nausea and vomiting)   . Heart palpitations     Hx: of with low potassium levels    Past Surgical History  Procedure Laterality Date  . Tubal ligation  1955  . S/p uterine ablation  2007  . S/p epicondylar release    . S/p leep procedure 2000 for abnormal pap      Normal since then  . Fracture surgery      Hx: of left ankle    Family History  Problem Relation Age of Onset  . Hypertension Father   . Hyperlipidemia Father   . Diabetes Father   . Colon cancer Paternal Grandfather    Social History:  reports that she has never smoked. She has never used smokeless tobacco. She reports that she does not drink alcohol or use illicit drugs.  Allergies:  Allergies  Allergen Reactions  . Cleocin (Clindamycin Hcl) Diarrhea and Nausea And Vomiting  . Frovatriptan Other (See Comments)    parasthesia  . Montelukast Sodium Other (See Comments)    parasthesia  . Propranolol Hcl     unknown  . Sumatriptan Other (See Comments)    Masked asthma  . Vancomycin Other (See Comments)    Had reaction during recovery, maybe hives or rash    No prescriptions prior to admission    No results  found for this or any previous visit (from the past 48 hour(s)). No results found.  Review of Systems  All other systems reviewed and are negative.    Height 5' 7.5" (1.715 m), weight 122.925 kg (271 lb), last menstrual period 02/06/2013. Physical Exam  Patient's foot is neurovascularly intact she has palpable pulses. There is prominent hardware which is tender to palpation. Assessment/Plan Assessment: Painful deep retained hardware left ankle medial malleolus.  Plan: We'll plan for removal of deep retained hardware. Risks and benefits were discussed including infection neurovascular injury pain need for additional surgery. Patient states she understands and wished to proceed at this time.  DUDA,MARCUS V 03/10/2013, 7:19 AM

## 2013-03-13 ENCOUNTER — Encounter (HOSPITAL_COMMUNITY): Payer: Self-pay | Admitting: Orthopedic Surgery

## 2013-03-20 NOTE — Anesthesia Postprocedure Evaluation (Signed)
  Anesthesia Post-op Note  Patient: Cassidy [name]  Procedure(s) Performed: Procedure(s) with comments: HARDWARE REMOVAL (Left) - Left Ankle Removal Medial Malleolus Screws  Patient Location: PACU  Anesthesia Type:General  Level of Consciousness: awake  Airway and Oxygen Therapy: Patient Spontanous Breathing  Post-op Pain: mild  Post-op Assessment: Post-op Vital signs reviewed  Post-op Vital Signs: stable  Complications: No apparent anesthesia complications

## 2013-03-31 ENCOUNTER — Encounter: Payer: Self-pay | Admitting: Internal Medicine

## 2013-03-31 MED ORDER — OMEPRAZOLE 20 MG PO CPDR: 40.0000 mg | DELAYED_RELEASE_CAPSULE | Freq: Every day | ORAL | Status: DC

## 2013-03-31 NOTE — Telephone Encounter (Signed)
Ok, this has been done 

## 2013-04-25 ENCOUNTER — Encounter: Payer: Self-pay | Admitting: Internal Medicine

## 2013-04-26 ENCOUNTER — Telehealth: Payer: Self-pay

## 2013-04-26 MED ORDER — HYDROCHLOROTHIAZIDE 25 MG PO TABS: 25.0000 mg | ORAL_TABLET | Freq: Every evening | ORAL | Status: DC

## 2013-04-26 NOTE — Telephone Encounter (Signed)
[facility]

## 2013-05-01 ENCOUNTER — Telehealth: Payer: Self-pay

## 2013-05-01 MED ORDER — DEXLANSOPRAZOLE 60 MG PO CPDR: 60.0000 mg | DELAYED_RELEASE_CAPSULE | Freq: Every day | ORAL | Status: AC

## 2013-05-01 NOTE — Telephone Encounter (Signed)
This has been done erx, with change from prilosec 40 to dexilant 60 per pt reqeust

## 2013-05-01 NOTE — Telephone Encounter (Signed)
Received refill request for Dexilant DR Caps 60 mg. To go to [facility], but this med. Is currently not on the patients med. List.  Please advise (Refer to email from 04/25/13)

## 2013-05-22 ENCOUNTER — Other Ambulatory Visit (INDEPENDENT_AMBULATORY_CARE_PROVIDER_SITE_OTHER): Payer: No Typology Code available for payment source

## 2013-05-22 ENCOUNTER — Other Ambulatory Visit: Payer: Self-pay | Admitting: Obstetrics and Gynecology

## 2013-05-22 DIAGNOSIS — Z0000 Encounter for general adult medical examination without abnormal findings: Secondary | ICD-10-CM

## 2013-05-22 LAB — HEMOGLOBIN A1C: Hgb A1c MFr Bld: 6.4 % (ref 4.6–6.5)

## 2013-09-07 ENCOUNTER — Other Ambulatory Visit: Payer: Self-pay

## 2013-10-01 ENCOUNTER — Other Ambulatory Visit: Payer: Self-pay | Admitting: Internal Medicine

## 2013-10-03 ENCOUNTER — Telehealth: Payer: Self-pay

## 2013-10-03 MED ORDER — FEXOFENADINE HCL 180 MG PO TABS: 180.0000 mg | ORAL_TABLET | Freq: Every evening | ORAL | Status: AC

## 2013-10-03 NOTE — Telephone Encounter (Signed)
Refill done per email request. 

## 2014-03-20 ENCOUNTER — Other Ambulatory Visit: Payer: Self-pay | Admitting: Internal Medicine

## 2014-06-04 ENCOUNTER — Other Ambulatory Visit: Payer: Self-pay | Admitting: Internal Medicine

## 2014-09-25 ENCOUNTER — Other Ambulatory Visit: Payer: Self-pay | Admitting: Obstetrics and Gynecology

## 2014-09-28 LAB — CYTOLOGY - PAP

## 2014-11-11 ENCOUNTER — Other Ambulatory Visit: Payer: Self-pay | Admitting: Internal Medicine

## 2015-11-03 HISTORY — PX: HYSTEROTOMY: SHX1776

## 2020-05-13 ENCOUNTER — Ambulatory Visit (INDEPENDENT_AMBULATORY_CARE_PROVIDER_SITE_OTHER): Payer: 59 | Admitting: Gastroenterology

## 2020-05-13 ENCOUNTER — Encounter (INDEPENDENT_AMBULATORY_CARE_PROVIDER_SITE_OTHER): Payer: Self-pay | Admitting: *Deleted

## 2020-05-13 ENCOUNTER — Encounter (INDEPENDENT_AMBULATORY_CARE_PROVIDER_SITE_OTHER): Payer: Self-pay | Admitting: Gastroenterology

## 2020-05-13 ENCOUNTER — Other Ambulatory Visit: Payer: Self-pay

## 2020-05-13 ENCOUNTER — Telehealth (INDEPENDENT_AMBULATORY_CARE_PROVIDER_SITE_OTHER): Payer: Self-pay | Admitting: *Deleted

## 2020-05-13 VITALS — BP 154/87 | HR 82 | Temp 98.9°F | Ht 68.0 in | Wt 277.0 lb

## 2020-05-13 DIAGNOSIS — R194 Change in bowel habit: Secondary | ICD-10-CM

## 2020-05-13 DIAGNOSIS — K219 Gastro-esophageal reflux disease without esophagitis: Secondary | ICD-10-CM

## 2020-05-13 MED ORDER — DICYCLOMINE HCL 10 MG PO CAPS
10.0000 mg | ORAL_CAPSULE | Freq: Two times a day (BID) | ORAL | 1 refills | Status: DC | PRN
Start: 2020-05-13 — End: 2023-01-01

## 2020-05-13 NOTE — Patient Instructions (Addendum)
Can try dicyclomine as needed as discussed  We are scheduling endoscopy and colonoscopy

## 2020-05-13 NOTE — Progress Notes (Addendum)
Patient profile: Cassidy [name] is a 49 y.o. female seen for evaluation of GERD.   History of Present Illness: Cassidy [name] is seen today and repots "stomach problems her whole life". She had her last endoscopy for dysphagia several years but did not have dilation and no evidence of EoE. No barretts. She was told to repeat in 2 years for "stomach polyps"  She reports chronic issues with lactose intolerance.  Feels like she doesn't digest leafy green vegetables. Pork and beef causes diarrhea.   She takes dexilant daily and does well as aovid spicy foods, now using pepcid PRN with dexilant (was previously on scheduled dexilant). She has tried nexium (no improvement), omeprazole (diarrhea), protonix (breakthrough GERD). No dysphaiga currently as long as avoids she avoids chasews. No nausea/vomiting currently.   Previously worked at [employer]. Paternal grandfather died at 28.   She takes dexilant daily and pepcid 23m and does well as aovid spicy foods, now using pepcid PRN. She has tried nexium, omeprazole (diarrhea), protonix (breakthrough GERD). No dysphaiga as long as avoids chasews which. No nausea/vomiting currently.   She reports sensitiviy to gluten but has had negative celiac testing.   Hx of 4th degree rectal laceration with child birth 89   She reports having "diarrhea" about 5-7 BM/day then can alternate with 2 days without a BM. After 2-3 days without a BM drinks black coffee/hot tea. Denies blood in stool.   Chronic rectal itching, has tried steriod cream 2017 with some improvement but not chronic.Recticare helps. No straining or blood in stool.  She reports trying Keefer, probiotics, Metamucil and Benefiber for change in bowel habits without improvement.  Beef and pork trigger abdominal pain.She reports chronic issues with lactose intolerance.  Feels like she doesn't digest leafy green vegetables.       Wt Readings from Last 3 Encounters:  05/13/20 277 lb (125.6 kg)    03/10/13 269 lb 13.5 oz (122.4 kg)  01/25/13 277 lb 6 oz (125.8 kg)     Last Colonoscopy:  [facility]   Past Medical History:  Past Medical History:  Diagnosis Date  . ANEMIA-IRON DEFICIENCY 09/20/2007  . ASTHMA 09/20/2007  . COMMON MIGRAINE 01/02/2009  . FEVER UNSPECIFIED 01/17/2008  . FROZEN RIGHT SHOULDER 07/10/2009  . GERD 01/02/2009  . GLUCOSE INTOLERANCE 01/02/2009  . Headache(784.0) 09/20/2007  . Heart palpitations    Hx: of with low potassium levels  . Hematuria 07/20/2011  . HYPERTENSION 01/02/2009  . Impaired glucose tolerance 07/17/2011  . LATERAL EPICONDYLITIS, RIGHT 06/05/2009  . Other and unspecified hyperlipidemia 01/15/2013  . PONV (postoperative nausea and vomiting)   . ROTATOR CUFF SYNDROME, RIGHT 05/27/2009  . SHOULDER PAIN, RIGHT 05/27/2009    Problem List: Patient Active Problem List   Diagnosis Date Noted  . Acute sinus infection 01/25/2013  . Unspecified asthma, with exacerbation 01/25/2013  . Vertigo 01/25/2013  . Other and unspecified hyperlipidemia 01/15/2013  . Hematuria 07/20/2011  . Impaired glucose tolerance 07/17/2011  . Preventative health care 07/17/2011  . FROZEN RIGHT SHOULDER 07/10/2009  . LATERAL EPICONDYLITIS, RIGHT 06/05/2009  . SHOULDER PAIN, RIGHT 05/27/2009  . ROTATOR CUFF SYNDROME, RIGHT 05/27/2009  . COMMON MIGRAINE 01/02/2009  . HYPERTENSION 01/02/2009  . GERD 01/02/2009  . ANEMIA-IRON DEFICIENCY 09/20/2007  . ASTHMA 09/20/2007  . Headache(784.0) 09/20/2007    Past Surgical History: Past Surgical History:  Procedure Laterality Date  . FRACTURE SURGERY     Hx:  of left ankle  . HARDWARE REMOVAL Left 03/10/2013   Procedure: HARDWARE REMOVAL;  Surgeon: Newt Minion, MD;  Location: [facility];  Service: Orthopedics;  Laterality: Left;  Left Ankle Removal Medial Malleolus Screws  . HYSTEROTOMY  2017  . s/p epicondylar release    . s/p LEEP procedure 2000 for abnormal pap      Normal since then  . s/p uterine ablation  2007  . TUBAL LIGATION  1955    Allergies: Allergies  Allergen Reactions  . Omeprazole   . Augmentin [Amoxicillin-Pot Clavulanate]   . Cleocin [Clindamycin Hcl] Diarrhea and Nausea And Vomiting  . Frovatriptan Other (See Comments)    parasthesia  . Gabapentin (Once-Daily)   . Montelukast Sodium Other (See Comments)    parasthesia  . Morphine And Related Nausea And Vomiting    And dizzy, and "feeling terrible"  . Propranolol Hcl     unknown  . Singulair [Montelukast]   . Sumatriptan Other (See Comments)    Masked asthma  . Vancomycin Other (See Comments)    Had reaction during recovery, maybe hives or rash  . Benzoin Rash    And blisters      Home Medications:  Current Outpatient Medications:  .  ARMODAFINIL PO, Take by mouth. 50 once a day, Disp: , Rfl:  .  cetirizine (ZYRTEC) 10 MG chewable tablet, Chew 10 mg by mouth daily., Disp: , Rfl:  .  cyclobenzaprine (FLEXERIL) 10 MG tablet, Take 10 mg by mouth daily as needed (tension headaches)., Disp: , Rfl:  .  dexlansoprazole (DEXILANT) 60 MG capsule, Take 1 capsule (60 mg total) by mouth daily., Disp: 90 capsule, Rfl: 3 .  diazepam (VALIUM) 2 MG tablet, Take 2 mg by mouth at bedtime as needed for anxiety or sleep., Disp: , Rfl:  .  fexofenadine (ALLEGRA) 180 MG tablet, Take 1 tablet (180 mg total) by mouth every evening., Disp: 180 tablet, Rfl: 2 .  hydrochlorothiazide (HYDRODIURIL) 25 MG tablet, TAKE 1 TABLET EVERY EVENING, Disp: 90 tablet, Rfl: 0 .  hydrOXYzine (ATARAX/VISTARIL) 25 MG tablet, Take 25 mg by mouth 3 (three) times daily as needed. As needed, Disp: , Rfl:  .  lidocaine (XYLOCAINE) 5 % ointment, Apply 1 application topically as needed., Disp: , Rfl:  .  Liraglutide -Weight Management (SAXENDA Pleak), Inject into the skin. 54m  Injection once a day, Disp: , Rfl:  .  losartan-hydrochlorothiazide (HYZAAR) 100-25 MG tablet, Take 1 tablet by mouth daily., Disp: , Rfl:  .   magnesium citrate SOLN, Take 1 Bottle by mouth once. 5036monce a day, Disp: , Rfl:  .  meclizine (ANTIVERT) 12.5 MG tablet, Take 12.5 mg by mouth 3 (three) times daily as needed (vertigo)., Disp: , Rfl:  .  Melatonin 10 MG CAPS, Take by mouth. 1  Tab at bedtime, Disp: , Rfl:  .  meloxicam (MOBIC) 7.5 MG tablet, Take 7.5 mg by mouth daily as needed (headache)., Disp: , Rfl:  .  Misc Natural Products (APPLE CIDER VINEGAR DIET PO), Take 1 capsule by mouth 2 (two) times daily., Disp: , Rfl:  .  Multiple Vitamin (MULTIVITAMIN WITH MINERALS) TABS, Take 1 tablet by mouth every evening., Disp: , Rfl:  .  promethazine (PHENERGAN) 25 MG tablet, Take 25 mg by mouth 2 (two) times daily as needed for nausea., Disp: , Rfl:  .  rizatriptan (MAXALT) 10 MG tablet, Take 1 tablet (10 mg total) by mouth daily as needed for migraine. 1 tablet by mouth  for Migraine.  May repeat in 2 hours (no more than 2 in 24 hours or 2-3 days a week)., Disp: 10 tablet, Rfl: 5 .  Vitamin D, Cholecalciferol, 25 MCG (1000 UT) TABS, Take by mouth. 5,000units once a day, Disp: , Rfl:  .  albuterol (PROVENTIL HFA;VENTOLIN HFA) 108 (90 BASE) MCG/ACT inhaler, Inhale 2 puffs into the lungs every 6 (six) hours as needed for wheezing. (Patient not taking: Reported on 05/13/2020), Disp: 3 Inhaler, Rfl: 3 .  COCONUT OIL PO, Take 1 capsule by mouth every evening. (Patient not taking: Reported on 05/13/2020), Disp: , Rfl:  .  dicyclomine (BENTYL) 10 MG capsule, Take 1 capsule (10 mg total) by mouth 2 (two) times daily as needed (diarrhea abd pain)., Disp: 90 capsule, Rfl: 1 .  HYDROcodone-acetaminophen (NORCO) 5-325 MG per tablet, Take 1 tablet by mouth every 6 (six) hours as needed for pain. (Patient not taking: Reported on 05/13/2020), Disp: 30 tablet, Rfl: 0 .  HYDROcodone-homatropine (HYCODAN) 5-1.5 MG/5ML syrup, Take 5 mLs by mouth every 6 (six) hours as needed for cough. (Patient not taking: Reported on 05/13/2020), Disp: 120 mL, Rfl: 1 .   pantoprazole (PROTONIX) 40 MG tablet, Take 40 mg by mouth every evening. (Patient not taking: Reported on 05/13/2020), Disp: , Rfl:  .  potassium chloride (K-DUR) 10 MEQ tablet, Take 10 mEq by mouth every evening. (Patient not taking: Reported on 05/13/2020), Disp: , Rfl:  .  Sodium Sulfate-Mag Sulfate-KCl (SUTAB) [phone] MG TABS, Take 1 kit by mouth once for 1 dose., Disp: 24 tablet, Rfl: 0   Family History: family history includes Colon cancer in her paternal grandfather; Diabetes in her father and mother; Hyperlipidemia in her father; Hypertension in her father.    Social History:   reports that she has never smoked. She has never used smokeless tobacco. She reports that she does not drink alcohol and does not use drugs.   Review of Systems: Constitutional: Denies weight loss/weight gain  Eyes: No changes in vision. ENT: No oral lesions, sore throat.  GI: see HPI.  Heme/Lymph: No easy bruising.  CV: No chest pain.  GU: No hematuria.  Integumentary: No rashes.  Neuro: No headaches.  Psych: No depression/anxiety.  Endocrine: No heat/cold intolerance.  Allergic/Immunologic: No urticaria.  Resp: No cough, SOB.  Musculoskeletal: No joint swelling.    Physical Examination: BP (!) 154/87 (BP Location: Right Arm, Patient Position: Sitting, Cuff Size: Normal)   Pulse 82   Temp 98.9 F (37.2 C) (Oral)   Ht 5' 8"  (1.727 m)   Wt 277 lb (125.6 kg)   BMI 42.12 kg/m  Gen: NAD, alert and oriented x 4 HEENT: PEERLA, EOMI, Neck: supple, no JVD Chest: CTA bilaterally, no wheezes, crackles, or other adventitious sounds CV: RRR, no m/g/c/r Abd: soft, NT, ND, +BS in all four quadrants; no HSM, guarding, ridigity, or rebound tenderness Ext: no edema, well perfused with 2+ pulses, Skin: no rash or lesions noted on observed skin Lymph: no noted LAD  Data:  12/2019--CBC normal, CMP normal, A1c 5.7, vit D 42,    Assessment/Plan: Ms. Riches is a 49 y.o. female  Lacreshia was seen today for new  patient (initial visit).  Diagnoses and all orders for this visit:  Gastroesophageal reflux disease without esophagitis  Change in bowel habits  Other orders -     dicyclomine (BENTYL) 10 MG capsule; Take 1 capsule (10 mg total) by mouth 2 (two) times daily as needed (diarrhea abd pain).  1.  GERD-well-controlled with Dexilant and Pepcid as needed.  She had endoscopy about 5 years ago which showed a repeat in 2 years.  Sounds like was unremarkable for fundic gland polyps but will request records for evaluation.  We will repeat her upper endoscopy.  2.  Change in bowel habits-chronic.  Suspect underlying irritable bowel.  Diarrhea is the most problematic when she is traveling.  She is interested in trying dicyclomine as needed.  We will give her to have on hand if needed.  She has tried fiber, probiotics in the past without improvement.  We will schedule patient for diagnostic colonoscopy along with EGD.  Patient denies CP, SOB, and use of blood thinners. I discussed the risks and benefits of procedure including bleeding, perforation, infection, missed lesions, medication reactions and possible hospitalization or surgery if complications. All questions answered. Will plan for propofol given daily valium use  I personally performed the service, non-incident to. (WP)  Laurine Blazer, [facility]

## 2020-05-13 NOTE — Telephone Encounter (Signed)
Patient needs Sutab (copay card) ° °

## 2020-05-14 MED ORDER — SUTAB 1479-225-188 MG PO TABS: 1.0000 | ORAL_TABLET | Freq: Once | ORAL | 0 refills | Status: AC

## 2020-05-23 ENCOUNTER — Other Ambulatory Visit: Payer: Self-pay

## 2020-05-23 ENCOUNTER — Ambulatory Visit (INDEPENDENT_AMBULATORY_CARE_PROVIDER_SITE_OTHER): Payer: 59

## 2020-05-23 ENCOUNTER — Ambulatory Visit (INDEPENDENT_AMBULATORY_CARE_PROVIDER_SITE_OTHER): Payer: 59 | Admitting: Orthopedic Surgery

## 2020-05-23 ENCOUNTER — Encounter: Payer: Self-pay | Admitting: Orthopedic Surgery

## 2020-05-23 VITALS — Ht 68.0 in | Wt 277.0 lb

## 2020-05-23 DIAGNOSIS — S62661A Nondisplaced fracture of distal phalanx of left index finger, initial encounter for closed fracture: Secondary | ICD-10-CM

## 2020-05-23 DIAGNOSIS — M79642 Pain in left hand: Secondary | ICD-10-CM | POA: Diagnosis not present

## 2020-05-27 ENCOUNTER — Encounter: Payer: Self-pay | Admitting: Orthopedic Surgery

## 2020-05-27 NOTE — Progress Notes (Signed)
Office Visit Note   Patient: Cassidy [name]           Date of Birth: 03-09-1971           MRN: [id number] Visit Date: 05/23/2020              Requested by: Beola Cord, [place] [address] PCP: Beola Cord, FNP  Chief Complaint  Patient presents with  . Left Index Finger - Pain, Establish Care, Fracture, Injury      HPI: Patient is a 49 year old woman who sustained a tuft fracture left index finger that was initially open.  Patient went to the ER at [facility] underwent irrigation debridement and laceration closure.  She has been using Neosporin she was given a aluminum foam dressing.  Assessment & Plan: Visit Diagnoses:  1. Pain of left hand   2. Closed nondisplaced fracture of distal phalanx of left index finger, initial encounter     Plan: Patient was given 2 additional splints to see if this would be more comfortable.  Patient states that she will remove the sutures on her own 2 weeks after surgery she will continue with Neosporin follow-up if she develops any clinical signs of infection.  Follow-Up Instructions: Return if symptoms worsen or fail to improve.   Ortho Exam  Patient is alert, oriented, no adenopathy, well-dressed, normal affect, normal respiratory effort. Examination patient has full active extension and flexion extensor mechanism and flexor mechanism is intact to the tuft.  There is no angular deformity there is no subungual hematoma a laceration is well approximated.  Patient was given 2 additional splints to see if this would be easier to use.  Imaging: No results found. No images are attached to the encounter.  Labs: Lab Results  Component Value Date   HGBA1C 6.4 05/22/2013   HGBA1C 5.7 07/03/2009   HGBA1C 5.9 01/02/2009     Lab Results  Component Value Date   ALBUMIN 3.5 01/13/2013   ALBUMIN 3.3 (L) 07/16/2011   ALBUMIN 3.4 (L) 01/02/2009    No results found for: MG No results found for: VD25OH  No  results found for: PREALBUMIN CBC EXTENDED Latest Ref Rng & Units 03/10/2013 01/13/2013 07/16/2011  WBC 4.0 - 10.5 K/uL 11.6(H) 10.7(H) 9.0  RBC 3.87 - 5.11 MIL/uL 4.68 4.91 4.21  HGB 12.0 - 15.0 g/dL 13.3 13.8 12.1  HCT 36 - 46 % 38.8 40.7 35.6(L)  PLT 150 - 400 K/uL 368 390.0 299.0  NEUTROABS 1.4 - 7.7 K/uL - 7.5 5.5  LYMPHSABS 0.7 - 4.0 K/uL - 2.5 2.8     Body mass index is 42.12 kg/m.  Orders:  Orders Placed This Encounter  Procedures  . XR Finger Index Left   No orders of the defined types were placed in this encounter.    Procedures: No procedures performed  Clinical Data: No additional findings.  ROS:  All other systems negative, except as noted in the HPI. Review of Systems  Objective: Vital Signs: Ht 5\' 8"  (1.727 m)   Wt (!) 277 lb (125.6 kg)   BMI 42.12 kg/m   Specialty Comments:  No specialty comments available.  PMFS History: Patient Active Problem List   Diagnosis Date Noted  . Acute sinus infection 01/25/2013  . Unspecified asthma, with exacerbation 01/25/2013  . Vertigo 01/25/2013  . Other and unspecified hyperlipidemia 01/15/2013  . Hematuria 07/20/2011  . Impaired glucose tolerance 07/17/2011  . Preventative health care 07/17/2011  . FROZEN  RIGHT SHOULDER 07/10/2009  . LATERAL EPICONDYLITIS, RIGHT 06/05/2009  . SHOULDER PAIN, RIGHT 05/27/2009  . ROTATOR CUFF SYNDROME, RIGHT 05/27/2009  . COMMON MIGRAINE 01/02/2009  . HYPERTENSION 01/02/2009  . GERD 01/02/2009  . ANEMIA-IRON DEFICIENCY 09/20/2007  . ASTHMA 09/20/2007  . Headache(784.0) 09/20/2007   Past Medical History:  Diagnosis Date  . ANEMIA-IRON DEFICIENCY 09/20/2007  . ASTHMA 09/20/2007  . COMMON MIGRAINE 01/02/2009  . FEVER UNSPECIFIED 01/17/2008  . FROZEN RIGHT SHOULDER 07/10/2009  . GERD 01/02/2009  . GLUCOSE INTOLERANCE 01/02/2009  . Headache(784.0) 09/20/2007  . Heart palpitations    Hx: of with low potassium levels  . Hematuria 07/20/2011  . HYPERTENSION 01/02/2009  . Impaired  glucose tolerance 07/17/2011  . LATERAL EPICONDYLITIS, RIGHT 06/05/2009  . Other and unspecified hyperlipidemia 01/15/2013  . PONV (postoperative nausea and vomiting)   . ROTATOR CUFF SYNDROME, RIGHT 05/27/2009  . SHOULDER PAIN, RIGHT 05/27/2009    Family History  Problem Relation Age of Onset  . Hypertension Father   . Hyperlipidemia Father   . Diabetes Father   . Colon cancer Paternal Grandfather   . Diabetes Mother     Past Surgical History:  Procedure Laterality Date  . FRACTURE SURGERY     Hx: of left ankle  . HARDWARE REMOVAL Left 03/10/2013   Procedure: HARDWARE REMOVAL;  Surgeon: Newt Minion, MD;  Location: [place];  Service: Orthopedics;  Laterality: Left;  Left Ankle Removal Medial Malleolus Screws  . HYSTEROTOMY  2017  . s/p epicondylar release    . s/p LEEP procedure 2000 for abnormal pap     Normal since then  . s/p uterine ablation  2007  . TUBAL LIGATION  1955   Social History   Occupational History  . Not on file  Tobacco Use  . Smoking status: Never Smoker  . Smokeless tobacco: Never Used  Vaping Use  . Vaping Use: Never used  Substance and Sexual Activity  . Alcohol use: No  . Drug use: No  . Sexual activity: Yes

## 2020-06-26 ENCOUNTER — Other Ambulatory Visit (INDEPENDENT_AMBULATORY_CARE_PROVIDER_SITE_OTHER): Payer: Self-pay | Admitting: *Deleted

## 2020-06-28 NOTE — Patient Instructions (Signed)
Jeffrie DHANVI BOESEN  06/28/2020     @PREFPERIOPPHARMACY @   Your procedure is scheduled on  07/03/2020.  Report to [facility] at  [phone]  A.M.  Call this number if you have problems the morning of surgery:  [phone]   Remember:  Follow the diet and prep instructions given to you by the office.                     Take these medicines the morning of surgery with A SIP OF WATER   Armodafainil, dexilant, hydroxyzine(if needed), antivert(if needed), mobic, (if needed0, maxalt(if needed).    Do not wear jewelry, make-up or nail polish.  Do not wear lotions, powders, or perfumes. Please wear deodorant  And brush your teeth.  Do not shave 48 hours prior to surgery.  Men may shave face and neck.  Do not bring valuables to the hospital.  [facility] is not responsible for any belongings or valuables.  Contacts, dentures or bridgework may not be worn into surgery.  Leave your suitcase in the car.  After surgery it may be brought to your room.  For patients admitted to the hospital, discharge time will be determined by your treatment team.  Patients discharged the day of surgery will not be allowed to drive home.   Name and phone number of your driver:   family Special instructions:  DO NOT smoke the morning of your procedure.  Please read over the following fact sheets that you were given. Anesthesia Post-op Instructions and Care and Recovery After Surgery       Upper Endoscopy, Adult, Care After This sheet gives you information about how to care for yourself after your procedure. Your health care provider may also give you more specific instructions. If you have problems or questions, contact your health care provider. What can I expect after the procedure? After the procedure, it is common to have:  A sore throat.  Mild stomach pain or discomfort.  Bloating.  Nausea. Follow these instructions at home:   Follow instructions from your health care provider about what to  eat or drink after your procedure.  Return to your normal activities as told by your health care provider. Ask your health care provider what activities are safe for you.  Take over-the-counter and prescription medicines only as told by your health care provider.  Do not drive for 24 hours if you were given a sedative during your procedure.  Keep all follow-up visits as told by your health care provider. This is important. Contact a health care provider if you have:  A sore throat that lasts longer than one day.  Trouble swallowing. Get help right away if:  You vomit blood or your vomit looks like coffee grounds.  You have: ? A fever. ? Bloody, black, or tarry stools. ? A severe sore throat or you cannot swallow. ? Difficulty breathing. ? Severe pain in your chest or abdomen. Summary  After the procedure, it is common to have a sore throat, mild stomach discomfort, bloating, and nausea.  Do not drive for 24 hours if you were given a sedative during the procedure.  Follow instructions from your health care provider about what to eat or drink after your procedure.  Return to your normal activities as told by your health care provider. This information is not intended to replace advice given to you by your health care provider. Make sure you discuss any  questions you have with your health care provider. Document Revised: 04/12/2018 Document Reviewed: 03/21/2018 Elsevier Patient Education  [place].  Colonoscopy, Adult, Care After This sheet gives you information about how to care for yourself after your procedure. Your health care provider may also give you more specific instructions. If you have problems or questions, contact your health care provider. What can I expect after the procedure? After the procedure, it is common to have:  A small amount of blood in your stool for 24 hours after the procedure.  Some gas.  Mild cramping or bloating of your  abdomen. Follow these instructions at home: Eating and drinking   Drink enough fluid to keep your urine pale yellow.  Follow instructions from your health care provider about eating or drinking restrictions.  Resume your normal diet as instructed by your health care provider. Avoid heavy or fried foods that are hard to digest. Activity  Rest as told by your health care provider.  Avoid sitting for a long time without moving. Get up to take short walks every 1-2 hours. This is important to improve blood flow and breathing. Ask for help if you feel weak or unsteady.  Return to your normal activities as told by your health care provider. Ask your health care provider what activities are safe for you. Managing cramping and bloating   Try walking around when you have cramps or feel bloated.  Apply heat to your abdomen as told by your health care provider. Use the heat source that your health care provider recommends, such as a moist heat pack or a heating pad. ? Place a towel between your skin and the heat source. ? Leave the heat on for 20-30 minutes. ? Remove the heat if your skin turns bright red. This is especially important if you are unable to feel pain, heat, or cold. You may have a greater risk of getting burned. General instructions  For the first 24 hours after the procedure: ? Do not drive or use machinery. ? Do not sign important documents. ? Do not drink alcohol. ? Do your regular daily activities at a slower pace than normal. ? Eat soft foods that are easy to digest.  Take over-the-counter and prescription medicines only as told by your health care provider.  Keep all follow-up visits as told by your health care provider. This is important. Contact a health care provider if:  You have blood in your stool 2-3 days after the procedure. Get help right away if you have:  More than a small spotting of blood in your stool.  Large blood clots in your stool.  Swelling  of your abdomen.  Nausea or vomiting.  A fever.  Increasing pain in your abdomen that is not relieved with medicine. Summary  After the procedure, it is common to have a small amount of blood in your stool. You may also have mild cramping and bloating of your abdomen.  For the first 24 hours after the procedure, do not drive or use machinery, sign important documents, or drink alcohol.  Get help right away if you have a lot of blood in your stool, nausea or vomiting, a fever, or increased pain in your abdomen. This information is not intended to replace advice given to you by your health care provider. Make sure you discuss any questions you have with your health care provider. Document Revised: 05/15/2019 Document Reviewed: 05/15/2019 Elsevier Patient Education  [place] After These  instructions provide you with information about caring for yourself after your procedure. Your health care provider may also give you more specific instructions. Your treatment has been planned according to current medical practices, but problems sometimes occur. Call your health care provider if you have any problems or questions after your procedure. What can I expect after the procedure? After your procedure, you may:  Feel sleepy for several hours.  Feel clumsy and have poor balance for several hours.  Feel forgetful about what happened after the procedure.  Have poor judgment for several hours.  Feel nauseous or vomit.  Have a sore throat if you had a breathing tube during the procedure. Follow these instructions at home: For at least 24 hours after the procedure:      Have a responsible adult stay with you. It is important to have someone help care for you until you are awake and alert.  Rest as needed.  Do not: ? Participate in activities in which you could fall or become injured. ? Drive. ? Use heavy machinery. ? Drink alcohol. ? Take  sleeping pills or medicines that cause drowsiness. ? Make important decisions or sign legal documents. ? Take care of children on your own. Eating and drinking  Follow the diet that is recommended by your health care provider.  If you vomit, drink water, juice, or soup when you can drink without vomiting.  Make sure you have little or no nausea before eating solid foods. General instructions  Take over-the-counter and prescription medicines only as told by your health care provider.  If you have sleep apnea, surgery and certain medicines can increase your risk for breathing problems. Follow instructions from your health care provider about wearing your sleep device: ? Anytime you are sleeping, including during daytime naps. ? While taking prescription pain medicines, sleeping medicines, or medicines that make you drowsy.  If you smoke, do not smoke without supervision.  Keep all follow-up visits as told by your health care provider. This is important. Contact a health care provider if:  You keep feeling nauseous or you keep vomiting.  You feel light-headed.  You develop a rash.  You have a fever. Get help right away if:  You have trouble breathing. Summary  For several hours after your procedure, you may feel sleepy and have poor judgment.  Have a responsible adult stay with you for at least 24 hours or until you are awake and alert. This information is not intended to replace advice given to you by your health care provider. Make sure you discuss any questions you have with your health care provider. Document Revised: 01/17/2018 Document Reviewed: 02/09/2016 Elsevier Patient Education  [place].

## 2020-06-28 NOTE — Pre-Procedure Instructions (Signed)
Spoke with Laurine Blazer, PA and modified consent per her order.

## 2020-07-01 ENCOUNTER — Encounter (HOSPITAL_COMMUNITY): Payer: Self-pay

## 2020-07-01 ENCOUNTER — Other Ambulatory Visit: Payer: Self-pay

## 2020-07-01 ENCOUNTER — Encounter (HOSPITAL_COMMUNITY)
Admission: RE | Admit: 2020-07-01 | Discharge: 2020-07-01 | Disposition: A | Discharge status: Home / Self Care | Payer: 59 | Source: Ambulatory Visit | Attending: Internal Medicine | Admitting: Internal Medicine

## 2020-07-01 ENCOUNTER — Other Ambulatory Visit (HOSPITAL_COMMUNITY)
Admission: RE | Admit: 2020-07-01 | Discharge: 2020-07-01 | Disposition: A | Discharge status: Home / Self Care | Payer: 59 | Source: Ambulatory Visit | Attending: Internal Medicine | Admitting: Internal Medicine

## 2020-07-01 DIAGNOSIS — Z20822 Contact with and (suspected) exposure to covid-19: Secondary | ICD-10-CM | POA: Diagnosis not present

## 2020-07-01 DIAGNOSIS — Z01812 Encounter for preprocedural laboratory examination: Secondary | ICD-10-CM | POA: Insufficient documentation

## 2020-07-01 LAB — BASIC METABOLIC PANEL
Anion gap: 12 (ref 5–15)
BUN: 16 mg/dL (ref 6–20)
CO2: 24 mmol/L (ref 22–32)
Calcium: 8.9 mg/dL (ref 8.9–10.3)
Chloride: 102 mmol/L (ref 98–111)
Creatinine, Ser: 0.85 mg/dL (ref 0.44–1.00)
GFR calc Af Amer: >60 mL/min (ref >60)
GFR calc non Af Amer: >60 mL/min (ref >60)
Glucose, Bld: 118 mg/dL — ABNORMAL HIGH (ref 70–99)
Potassium: 3.5 mmol/L (ref 3.5–5.1)
Sodium: 138 mmol/L (ref 135–145)

## 2020-07-01 LAB — CBC WITH DIFFERENTIAL/PLATELET
Abs Immature Granulocytes: 0.02 10*3/uL (ref 0.00–0.07)
Basophils Absolute: 0 10*3/uL (ref 0.0–0.1)
Basophils Relative: 0 %
Eosinophils Absolute: 0.3 10*3/uL (ref 0.0–0.5)
Eosinophils Relative: 4 %
HCT: 37.1 % (ref 36.0–46.0)
Hemoglobin: 12.2 g/dL (ref 12.0–15.0)
Immature Granulocytes: 0 %
Lymphocytes Relative: 30 %
Lymphs Abs: 2.8 10*3/uL (ref 0.7–4.0)
MCH: 28.8 pg (ref 26.0–34.0)
MCHC: 32.9 g/dL (ref 30.0–36.0)
MCV: 87.5 fL (ref 80.0–100.0)
Monocytes Absolute: 0.4 10*3/uL (ref 0.1–1.0)
Monocytes Relative: 4 %
Neutro Abs: 5.7 10*3/uL (ref 1.7–7.7)
Neutrophils Relative %: 62 %
Platelets: 365 10*3/uL (ref 150–400)
RBC: 4.24 MIL/uL (ref 3.87–5.11)
RDW: 12.2 % (ref 11.5–15.5)
WBC: 9.3 10*3/uL (ref 4.0–10.5)
nRBC: 0 % (ref 0.0–0.2)

## 2020-07-02 LAB — SARS CORONAVIRUS 2 (TAT 6-24 HRS): SARS Coronavirus 2: NEGATIVE

## 2020-07-03 ENCOUNTER — Encounter (HOSPITAL_COMMUNITY)
Admission: RE | Disposition: A | Discharge status: Home / Self Care | Payer: Self-pay | Source: Home / Self Care | Attending: Internal Medicine

## 2020-07-03 ENCOUNTER — Ambulatory Visit (HOSPITAL_COMMUNITY)
Admission: RE | Admit: 2020-07-03 | Discharge: 2020-07-03 | Disposition: A | Discharge status: Home / Self Care | Payer: 59 | Attending: Internal Medicine | Admitting: Internal Medicine

## 2020-07-03 ENCOUNTER — Encounter (HOSPITAL_COMMUNITY): Payer: Self-pay | Admitting: Internal Medicine

## 2020-07-03 ENCOUNTER — Ambulatory Visit (HOSPITAL_COMMUNITY): Payer: 59 | Admitting: Anesthesiology

## 2020-07-03 ENCOUNTER — Other Ambulatory Visit: Payer: Self-pay

## 2020-07-03 DIAGNOSIS — I10 Essential (primary) hypertension: Secondary | ICD-10-CM | POA: Diagnosis not present

## 2020-07-03 DIAGNOSIS — R197 Diarrhea, unspecified: Secondary | ICD-10-CM

## 2020-07-03 DIAGNOSIS — Z79899 Other long term (current) drug therapy: Secondary | ICD-10-CM | POA: Diagnosis not present

## 2020-07-03 DIAGNOSIS — Z791 Long term (current) use of non-steroidal anti-inflammatories (NSAID): Secondary | ICD-10-CM | POA: Diagnosis not present

## 2020-07-03 DIAGNOSIS — R1310 Dysphagia, unspecified: Secondary | ICD-10-CM | POA: Insufficient documentation

## 2020-07-03 DIAGNOSIS — Z800 Family history of malignant neoplasm of digestive organs: Secondary | ICD-10-CM | POA: Diagnosis not present

## 2020-07-03 DIAGNOSIS — D649 Anemia, unspecified: Secondary | ICD-10-CM | POA: Diagnosis not present

## 2020-07-03 DIAGNOSIS — D120 Benign neoplasm of cecum: Secondary | ICD-10-CM

## 2020-07-03 DIAGNOSIS — J45909 Unspecified asthma, uncomplicated: Secondary | ICD-10-CM | POA: Insufficient documentation

## 2020-07-03 DIAGNOSIS — R519 Headache, unspecified: Secondary | ICD-10-CM | POA: Diagnosis not present

## 2020-07-03 DIAGNOSIS — Z885 Allergy status to narcotic agent status: Secondary | ICD-10-CM | POA: Insufficient documentation

## 2020-07-03 DIAGNOSIS — E7439 Other disorders of intestinal carbohydrate absorption: Secondary | ICD-10-CM | POA: Insufficient documentation

## 2020-07-03 DIAGNOSIS — Z880 Allergy status to penicillin: Secondary | ICD-10-CM | POA: Insufficient documentation

## 2020-07-03 DIAGNOSIS — K529 Noninfective gastroenteritis and colitis, unspecified: Secondary | ICD-10-CM

## 2020-07-03 DIAGNOSIS — K2100 Gastro-esophageal reflux disease with esophagitis, without bleeding: Secondary | ICD-10-CM | POA: Insufficient documentation

## 2020-07-03 DIAGNOSIS — K228 Other specified diseases of esophagus: Secondary | ICD-10-CM

## 2020-07-03 DIAGNOSIS — M1990 Unspecified osteoarthritis, unspecified site: Secondary | ICD-10-CM | POA: Insufficient documentation

## 2020-07-03 DIAGNOSIS — K648 Other hemorrhoids: Secondary | ICD-10-CM | POA: Insufficient documentation

## 2020-07-03 DIAGNOSIS — Z881 Allergy status to other antibiotic agents status: Secondary | ICD-10-CM | POA: Diagnosis not present

## 2020-07-03 DIAGNOSIS — K317 Polyp of stomach and duodenum: Secondary | ICD-10-CM | POA: Insufficient documentation

## 2020-07-03 DIAGNOSIS — K219 Gastro-esophageal reflux disease without esophagitis: Secondary | ICD-10-CM

## 2020-07-03 DIAGNOSIS — Z888 Allergy status to other drugs, medicaments and biological substances status: Secondary | ICD-10-CM | POA: Insufficient documentation

## 2020-07-03 DIAGNOSIS — E785 Hyperlipidemia, unspecified: Secondary | ICD-10-CM | POA: Diagnosis not present

## 2020-07-03 DIAGNOSIS — Z8249 Family history of ischemic heart disease and other diseases of the circulatory system: Secondary | ICD-10-CM | POA: Diagnosis not present

## 2020-07-03 DIAGNOSIS — Z8349 Family history of other endocrine, nutritional and metabolic diseases: Secondary | ICD-10-CM | POA: Diagnosis not present

## 2020-07-03 DIAGNOSIS — Z90710 Acquired absence of both cervix and uterus: Secondary | ICD-10-CM | POA: Diagnosis not present

## 2020-07-03 DIAGNOSIS — Z833 Family history of diabetes mellitus: Secondary | ICD-10-CM | POA: Insufficient documentation

## 2020-07-03 DIAGNOSIS — K5730 Diverticulosis of large intestine without perforation or abscess without bleeding: Secondary | ICD-10-CM

## 2020-07-03 DIAGNOSIS — K644 Residual hemorrhoidal skin tags: Secondary | ICD-10-CM

## 2020-07-03 HISTORY — PX: COLONOSCOPY WITH PROPOFOL: SHX5780

## 2020-07-03 HISTORY — PX: BIOPSY: SHX5522

## 2020-07-03 HISTORY — PX: ESOPHAGOGASTRODUODENOSCOPY (EGD) WITH PROPOFOL: SHX5813

## 2020-07-03 HISTORY — PX: POLYPECTOMY: SHX5525

## 2020-07-03 LAB — HM COLONOSCOPY

## 2020-07-03 SURGERY — COLONOSCOPY WITH PROPOFOL
Anesthesia: General

## 2020-07-03 MED ORDER — GLYCOPYRROLATE 0.2 MG/ML IJ SOLN
0.2000 mg | Freq: Once | INTRAMUSCULAR | Status: AC
  Administered 2020-07-03: 0.2 mg via INTRAVENOUS

## 2020-07-03 MED ORDER — MIDAZOLAM HCL 5 MG/5ML IJ SOLN
INTRAMUSCULAR | Status: DC | PRN
  Administered 2020-07-03: 2 mg via INTRAVENOUS

## 2020-07-03 MED ORDER — STERILE WATER FOR IRRIGATION IR SOLN
Status: DC | PRN
  Administered 2020-07-03: 1.5 mL

## 2020-07-03 MED ORDER — PROPOFOL 10 MG/ML IV BOLUS
INTRAVENOUS | Status: DC | PRN
  Administered 2020-07-03: 100 mg via INTRAVENOUS

## 2020-07-03 MED ORDER — MIDAZOLAM HCL 2 MG/2ML IJ SOLN
INTRAMUSCULAR | Status: AC
  Filled 2020-07-03: qty 2

## 2020-07-03 MED ORDER — LIDOCAINE VISCOUS HCL 2 % MT SOLN
OROMUCOSAL | Status: AC
  Filled 2020-07-03: qty 15

## 2020-07-03 MED ORDER — LACTATED RINGERS IV SOLN: INTRAVENOUS | Status: DC | PRN

## 2020-07-03 MED ORDER — LIDOCAINE VISCOUS HCL 2 % MT SOLN
15.0000 mL | Freq: Once | OROMUCOSAL | Status: AC
  Administered 2020-07-03: 15 mL via OROMUCOSAL

## 2020-07-03 MED ORDER — LACTATED RINGERS IV SOLN
Freq: Once | INTRAVENOUS | Status: AC
  Administered 2020-07-03: 1000 mL via INTRAVENOUS

## 2020-07-03 MED ORDER — LIDOCAINE VISCOUS HCL 2 % MT SOLN
OROMUCOSAL | Status: DC | PRN
  Administered 2020-07-03: 1 via OROMUCOSAL

## 2020-07-03 MED ORDER — PROPOFOL 500 MG/50ML IV EMUL
INTRAVENOUS | Status: DC | PRN
  Administered 2020-07-03: 150 ug/kg/min via INTRAVENOUS

## 2020-07-03 MED ORDER — PROPOFOL 10 MG/ML IV BOLUS
INTRAVENOUS | Status: AC
  Filled 2020-07-03: qty 120

## 2020-07-03 MED ORDER — GLYCOPYRROLATE 0.2 MG/ML IJ SOLN
INTRAMUSCULAR | Status: AC
  Filled 2020-07-03: qty 1

## 2020-07-03 NOTE — Op Note (Signed)
[facility] Patient Name: Cassidy [name] Procedure Date: 07/03/2020 7:58 AM MRN: [id number] Date of Birth: Mar 25, 1971 Attending MD: Hildred Laser , MD CSN: [id number] Age: 49 Admit Type: Outpatient Procedure:                Colonoscopy Indications:              Chronic diarrhea Providers:                Hildred Laser, MD, Charlsie Quest. Theda Sers RN, RN, Casimer Bilis, Technician, Aram Candela Referring MD:             Beola Cord, NP Medicines:                Propofol per Anesthesia Complications:            No immediate complications. Estimated Blood Loss:     Estimated blood loss was minimal. Procedure:                Pre-Anesthesia Assessment:                           - Prior to the procedure, a History and Physical                            was performed, and patient medications and                            allergies were reviewed. The patient's tolerance of                            previous anesthesia was also reviewed. The risks                            and benefits of the procedure and the sedation                            options and risks were discussed with the patient.                            All questions were answered, and informed consent                            was obtained. Prior Anticoagulants: The patient has                            taken no previous anticoagulant or antiplatelet                            agents except for NSAID medication. ASA Grade                            Assessment: II - A patient with mild systemic  disease. After reviewing the risks and benefits,                            the patient was deemed in satisfactory condition to                            undergo the procedure.                           After obtaining informed consent, the colonoscope                            was passed under direct vision. Throughout the                            procedure, the  patient's blood pressure, pulse, and                            oxygen saturations were monitored continuously. The                            [id number]-H190DL ([id number]) scope was introduced through                            the anus and advanced to the the cecum, identified                            by appendiceal orifice and ileocecal valve. The                            colonoscopy was performed without difficulty. The                            patient tolerated the procedure well. The quality                            of the bowel preparation was adequate. The                            ileocecal valve, appendiceal orifice, and rectum                            were photographed. The ileocecal valve, appendiceal                            orifice, and rectum were photographed. Scope In: 8:00:25 AM Scope Out: 8:15:36 AM Scope Withdrawal Time: 0 hours 11 minutes 41 seconds  Total Procedure Duration: 0 hours 15 minutes 11 seconds  Findings:      The perianal and digital rectal examinations were normal.      A small polyp was found in the cecum. The polyp was sessile. Biopsies       were taken with a cold forceps for histology. The pathology specimen was       placed into Bottle Number 3.  A few diverticula were found in the sigmoid colon.      The exam was otherwise normal throughout the examined colon.      Biopsies for histology were taken with a cold forceps from the ascending       colon and sigmoid colon for evaluation of microscopic colitis. The       pathology specimen was placed into Bottle Number 4.      External and internal hemorrhoids were found during retroflexion. The       hemorrhoids were small.. Impression:               - One small polyp in the cecum. Biopsied.                           - Diverticulosis in the sigmoid colon.                           - External and internal hemorrhoids.                           - Biopsies were taken with a cold forceps from the                             ascending colon and sigmoid colon for evaluation of                            microscopic colitis. Moderate Sedation:      Per Anesthesia Care Recommendation:           - Patient has a contact number available for                            emergencies. The signs and symptoms of potential                            delayed complications were discussed with the                            patient. Return to normal activities tomorrow.                            Written discharge instructions were provided to the                            patient.                           - High fiber diet and diabetic (ADA) diet today.                           - Continue present medications.                           - Await pathology results.                           - See the other procedure note for documentation  of                            additional recommendations.                           - Repeat colonoscopy is recommended. The                            colonoscopy date will be determined after pathology                            results from today's exam become available for                            review. Procedure Code(s):        --- Professional ---                           [phone], Colonoscopy, flexible; with biopsy, single                            or multiple Diagnosis Code(s):        --- Professional ---                           K63.5, Polyp of colon                           K64.4, Residual hemorrhoidal skin tags                           K52.9, Noninfective gastroenteritis and colitis,                            unspecified                           K57.30, Diverticulosis of large intestine without                            perforation or abscess without bleeding CPT copyright 2019 American Medical Association. All rights reserved. The codes documented in this report are preliminary and upon coder review may  be revised to meet current compliance  requirements. Hildred Laser, MD Hildred Laser, MD 07/03/2020 8:34:06 AM This report has been signed electronically. Number of Addenda: 0

## 2020-07-03 NOTE — Anesthesia Preprocedure Evaluation (Signed)
Anesthesia Evaluation  Patient identified by MRN, date of birth, ID band Patient awake    Reviewed: Allergy & Precautions, NPO status , Patient's Chart, lab work & pertinent test results  History of Anesthesia Complications (+) PONV and history of anesthetic complications  Airway Mallampati: II  TM Distance: >3 FB Neck ROM: Full    Dental  (+) Dental Advisory Given, Missing   Pulmonary asthma ,    Pulmonary exam normal breath sounds clear to auscultation       Cardiovascular Exercise Tolerance: Good hypertension, Pt. on medications Normal cardiovascular exam Rhythm:Regular Rate:Normal     Neuro/Psych  Headaches, negative psych ROS   GI/Hepatic Neg liver ROS, GERD  Medicated and Controlled,  Endo/Other  negative endocrine ROS  Renal/GU negative Renal ROS  negative genitourinary   Musculoskeletal  (+) Arthritis , Osteoarthritis,    Abdominal   Peds negative pediatric ROS (+)  Hematology  (+) anemia ,   Anesthesia Other Findings   Reproductive/Obstetrics                           Anesthesia Physical Anesthesia Plan  ASA: III  Anesthesia Plan: General   Post-op Pain Management:    Induction: Intravenous  PONV Risk Score and Plan: TIVA  Airway Management Planned: Nasal Cannula and Natural Airway  Additional Equipment:   Intra-op Plan:   Post-operative Plan:   Informed Consent: I have reviewed the patients History and Physical, chart, labs and discussed the procedure including the risks, benefits and alternatives for the proposed anesthesia with the patient or authorized representative who has indicated his/her understanding and acceptance.     Dental advisory given  Plan Discussed with: CRNA and Surgeon  Anesthesia Plan Comments:         Anesthesia Quick Evaluation

## 2020-07-03 NOTE — Anesthesia Postprocedure Evaluation (Signed)
Anesthesia Post Note  Patient: Jonel M Ebrahimi  Procedure(s) Performed: COLONOSCOPY WITH PROPOFOL (N/A ) ESOPHAGOGASTRODUODENOSCOPY (EGD) WITH PROPOFOL (N/A ) BIOPSY POLYPECTOMY  Patient location during evaluation: PACU Anesthesia Type: General Level of consciousness: awake, awake and alert, oriented and patient cooperative Pain management: pain level controlled Vital Signs Assessment: post-procedure vital signs reviewed and stable Respiratory status: spontaneous breathing, nonlabored ventilation and respiratory function stable Cardiovascular status: stable Postop Assessment: no apparent nausea or vomiting Anesthetic complications: no   No complications documented.   Last Vitals:  Vitals:   07/03/20 0642  BP: 136/72  Pulse: 77  Resp: 18  Temp: 36.9 C  SpO2: 100%    Last Pain:  Vitals:   07/03/20 0736  TempSrc:   PainSc: 0-No pain                 Muhamad Serano

## 2020-07-03 NOTE — Op Note (Signed)
[facility] Patient Name: Cassidy [name] Procedure Date: 07/03/2020 7:14 AM MRN: [id number] Date of Birth: April 07, 1971 Attending MD: Hildred Laser , MD CSN: [id number] Age: 49 Admit Type: Outpatient Procedure:                Upper GI endoscopy Indications:              Gastro-esophageal reflux disease, Follow-up of                            gastro-esophageal reflux disease, Diarrhea Providers:                Hildred Laser, MD, Charlsie Quest. Theda Sers RN, RN, Casimer Bilis, Technician, Aram Candela Referring MD:             Beola Cord, NP Medicines:                Propofol per Anesthesia Complications:            No immediate complications. Estimated Blood Loss:     Estimated blood loss was minimal. Procedure:                Pre-Anesthesia Assessment:                           - Prior to the procedure, a History and Physical                            was performed, and patient medications and                            allergies were reviewed. The patient's tolerance of                            previous anesthesia was also reviewed. The risks                            and benefits of the procedure and the sedation                            options and risks were discussed with the patient.                            All questions were answered, and informed consent                            was obtained. Prior Anticoagulants: The patient has                            taken no previous anticoagulant or antiplatelet                            agents except for NSAID medication. ASA Grade  Assessment: III - A patient with mild systemic                            disease. After reviewing the risks and benefits,                            the patient was deemed in satisfactory condition to                            undergo the procedure.                           After obtaining informed consent, the endoscope was                             passed under direct vision. Throughout the                            procedure, the patient's blood pressure, pulse, and                            oxygen saturations were monitored continuously. The                            GIF-H190 ([id number]) scope was introduced through the                            mouth, and advanced to the second part of duodenum.                            The upper GI endoscopy was accomplished without                            difficulty. The patient tolerated the procedure                            well. Scope In: 7:39:57 AM Scope Out: 7:56:21 AM Total Procedure Duration: 0 hours 16 minutes 24 seconds  Findings:      The hypopharynx was normal.      The examined esophagus was normal.      The Z-line was irregular and was found 39 cm from the incisors.      Multiple 5 to 10 mm pedunculated and sessile polyps with no bleeding and       no stigmata of recent bleeding were found in the gastric fundus and in       the gastric body. five polyps were removed with a hot snare. Resection       and retrieval were complete. The pathology specimen was placed into       Bottle Number 2.      The exam of the stomach was otherwise normal.      The duodenal bulb and second portion of the duodenum were normal.       Biopsies for histology were taken with a cold forceps for evaluation of       celiac disease. The pathology specimen was  placed into Bottle Number 1. Impression:               - Normal hypopharynx.                           - Normal esophagus.                           - Z-line irregular, 39 cm from the incisors.                           - Multiple gastric polyps. Five resected and                            retrieved.                           - Normal duodenal bulb and second portion of the                            duodenum. Biopsied. Moderate Sedation:      Per Anesthesia Care Recommendation:           - Patient has a contact number  available for                            emergencies. The signs and symptoms of potential                            delayed complications were discussed with the                            patient. Return to normal activities tomorrow.                            Written discharge instructions were provided to the                            patient.                           - Resume previous diet today.                           - Continue present medications.                           - No aspirin, ibuprofen, naproxen, or other                            non-steroidal anti-inflammatory drugs for 7 days                            after polyp removal.                           - Await pathology results. Procedure Code(s):        ---  Professional ---                           [phone], Esophagogastroduodenoscopy, flexible,                            transoral; with removal of tumor(s), polyp(s), or                            other lesion(s) by snare technique Diagnosis Code(s):        --- Professional ---                           K22.8, Other specified diseases of esophagus                           K31.7, Polyp of stomach and duodenum                           K21.9, Gastro-esophageal reflux disease without                            esophagitis                           R19.7, Diarrhea, unspecified CPT copyright 2019 American Medical Association. All rights reserved. The codes documented in this report are preliminary and upon coder review may  be revised to meet current compliance requirements. Hildred Laser, MD Hildred Laser, MD 07/03/2020 8:28:51 AM This report has been signed electronically. Number of Addenda: 0

## 2020-07-03 NOTE — Discharge Instructions (Signed)
No aspirin or NSAIDs for 1 week. Resume other medications as before. High-fiber diet. No driving for 24 hours. Physician will call with biopsy results.         Monitored Anesthesia Care, Care After These instructions provide you with information about caring for yourself after your procedure. Your health care provider may also give you more specific instructions. Your treatment has been planned according to current medical practices, but problems sometimes occur. Call your health care provider if you have any problems or questions after your procedure. What can I expect after the procedure? After your procedure, you may:  Feel sleepy for several hours.  Feel clumsy and have poor balance for several hours.  Feel forgetful about what happened after the procedure.  Have poor judgment for several hours.  Feel nauseous or vomit.  Have a sore throat if you had a breathing tube during the procedure. Follow these instructions at home: For at least 24 hours after the procedure:      Have a responsible adult stay with you. It is important to have someone help care for you until you are awake and alert.  Rest as needed.  Do not: ? Participate in activities in which you could fall or become injured. ? Drive. ? Use heavy machinery. ? Drink alcohol. ? Take sleeping pills or medicines that cause drowsiness. ? Make important decisions or sign legal documents. ? Take care of children on your own. Eating and drinking  Follow the diet that is recommended by your health care provider.  If you vomit, drink water, juice, or soup when you can drink without vomiting.  Make sure you have little or no nausea before eating solid foods. General instructions  Take over-the-counter and prescription medicines only as told by your health care provider.  If you have sleep apnea, surgery and certain medicines can increase your risk for breathing problems. Follow instructions from your health  care provider about wearing your sleep device: ? Anytime you are sleeping, including during daytime naps. ? While taking prescription pain medicines, sleeping medicines, or medicines that make you drowsy.  If you smoke, do not smoke without supervision.  Keep all follow-up visits as told by your health care provider. This is important. Contact a health care provider if:  You keep feeling nauseous or you keep vomiting.  You feel light-headed.  You develop a rash.  You have a fever. Get help right away if:  You have trouble breathing. Summary  For several hours after your procedure, you may feel sleepy and have poor judgment.  Have a responsible adult stay with you for at least 24 hours or until you are awake and alert. This information is not intended to replace advice given to you by your health care provider. Make sure you discuss any questions you have with your health care provider. Document Revised: 01/17/2018 Document Reviewed: 02/09/2016 Elsevier Patient Education  [place].  Colon Polyps  Polyps are tissue growths inside the body. Polyps can grow in many places, including the large intestine (colon). A polyp may be a round bump or a mushroom-shaped growth. You could have one polyp or several. Most colon polyps are noncancerous (benign). However, some colon polyps can become cancerous over time. Finding and removing the polyps early can help prevent this. What are the causes? The exact cause of colon polyps is not known. What increases the risk? You are more likely to develop this condition if you:  Have a family history of colon  cancer or colon polyps.  Are older than 40 or older than 45 if you are African American.  Have inflammatory bowel disease, such as ulcerative colitis or Crohn's disease.  Have certain hereditary conditions, such as: ? Familial adenomatous polyposis. ? Lynch syndrome. ? Turcot syndrome. ? Peutz-Jeghers syndrome.  Are  overweight.  Smoke cigarettes.  Do not get enough exercise.  Drink too much alcohol.  Eat a diet that is high in fat and red meat and low in fiber.  Had childhood cancer that was treated with abdominal radiation. What are the signs or symptoms? Most polyps do not cause symptoms. If you have symptoms, they may include:  Blood coming from your rectum when having a bowel movement.  Blood in your stool. The stool may look dark red or black.  Abdominal pain.  A change in bowel habits, such as constipation or diarrhea. How is this diagnosed? This condition is diagnosed with a colonoscopy. This is a procedure in which a lighted, flexible scope is inserted into the anus and then passed into the colon to examine the area. Polyps are sometimes found when a colonoscopy is done as part of routine cancer screening tests. How is this treated? Treatment for this condition involves removing any polyps that are found. Most polyps can be removed during a colonoscopy. Those polyps will then be tested for cancer. Additional treatment may be needed depending on the results of testing. Follow these instructions at home: Lifestyle  Maintain a healthy weight, or lose weight if recommended by your health care provider.  Exercise every day or as told by your health care provider.  Do not use any products that contain nicotine or tobacco, such as cigarettes and e-cigarettes. If you need help quitting, ask your health care provider.  If you drink alcohol, limit how much you have: ? 0-1 drink a day for women. ? 0-2 drinks a day for men.  Be aware of how much alcohol is in your drink. In the [place] one drink equals one 12 oz bottle of beer (355 mL), one 5 oz glass of wine (148 mL), or one 1 oz shot of hard liquor (44 mL). Eating and drinking   Eat foods that are high in fiber, such as fruits, vegetables, and whole grains.  Eat foods that are high in calcium and vitamin D, such as milk, cheese, yogurt,  eggs, liver, fish, and broccoli.  Limit foods that are high in fat, such as fried foods and desserts.  Limit the amount of red meat and processed meat you eat, such as hot dogs, sausage, bacon, and lunch meats. General instructions  Keep all follow-up visits as told by your health care provider. This is important. ? This includes having regularly scheduled colonoscopies. ? Talk to your health care provider about when you need a colonoscopy. Contact a health care provider if:  You have new or worsening bleeding during a bowel movement.  You have new or increased blood in your stool.  You have a change in bowel habits.  You lose weight for no known reason. Summary  Polyps are tissue growths inside the body. Polyps can grow in many places, including the colon.  Most colon polyps are noncancerous (benign), but some can become cancerous over time.  This condition is diagnosed with a colonoscopy.  Treatment for this condition involves removing any polyps that are found. Most polyps can be removed during a colonoscopy. This information is not intended to replace advice given to you by your  health care provider. Make sure you discuss any questions you have with your health care provider. Document Revised: 02/03/2018 Document Reviewed: 02/03/2018 Elsevier Patient Education  [place] After This sheet gives you information about how to care for yourself after your procedure. Your health care provider may also give you more specific instructions. If you have problems or questions, contact your health care provider. What can I expect after the procedure? After the procedure, it is common to have:  A small amount of blood in your stool for 24 hours after the procedure.  Some gas.  Mild cramping or bloating in your abdomen. Follow these instructions at home: General instructions  For the first 24 hours after the procedure: ? Do not drive or use  machinery. ? Do not sign important documents. ? Do not drink alcohol. ? Do your regular daily activities at a slower pace than normal. ? Eat soft, easy-to-digest foods. ? Rest often.  Take over-the-counter or prescription medicines only as told by your health care provider.  Keep all follow-up visits as told by your health care provider. This is important. Relieving cramping and bloating   Try walking around when you have cramps or feel bloated.  Put heat on your abdomen as told by your health care provider. Use a heat source that your health care provider recommends, such as a moist heat pack or a heating pad. ? Place a towel between your skin and the heat source. ? Leave the heat on for 20-30 minutes. ? Remove the heat if your skin turns bright red. This is especially important if you are unable to feel pain, heat, or cold. You may have a greater risk of getting burned. Eating and drinking  Drink enough fluid to keep your urine pale yellow.  Return to your normal diet as instructed by your health care provider. Avoid heavy or fried foods that are hard to digest.  Avoid drinking alcohol for as long as told by your health care provider. Contact a health care provider if:  You have blood in your stool 2-3 days after the procedure. Get help right away if:  You have more than a small spotting of blood in your stool.  You pass large blood clots in your stool.  Your abdomen is swollen.  You have nausea or vomiting.  You have a fever.  You have increasing abdominal pain that is not relieved with medicine. Summary  After the procedure, it is common to have mild cramping and bloating in the abdomen.  Do not drive for 24 hours after the procedure.  Try walking around when you have cramps or feel bloated. This information is not intended to replace advice given to you by your health care provider. Make sure you discuss any questions you have with your health care  provider. Document Revised: 10/01/2017 Document Reviewed: 03/30/2017 Elsevier Patient Education  [place].  High-Fiber Diet Fiber, also called dietary fiber, is a type of carbohydrate that is found in fruits, vegetables, whole grains, and beans. A high-fiber diet can have many health benefits. Your health care provider may recommend a high-fiber diet to help:  Prevent constipation. Fiber can make your bowel movements more regular.  Lower your cholesterol.  Relieve the following conditions: ? Swelling of veins in the anus (hemorrhoids). ? Swelling and irritation (inflammation) of specific areas of the digestive tract (uncomplicated diverticulosis). ? A problem of the large intestine (colon) that sometimes causes pain and diarrhea (irritable bowel  syndrome, IBS).  Prevent overeating as part of a weight-loss plan.  Prevent heart disease, type 2 diabetes, and certain cancers. What is my plan? The recommended daily fiber intake in grams (g) includes:  38 g for men age 85 or younger.  30 g for men over age 73.  22 g for women age 90 or younger.  21 g for women over age 83. You can get the recommended daily intake of dietary fiber by:  Eating a variety of fruits, vegetables, grains, and beans.  Taking a fiber supplement, if it is not possible to get enough fiber through your diet. What do I need to know about a high-fiber diet?  It is better to get fiber through food sources rather than from fiber supplements. There is not a lot of research about how effective supplements are.  Always check the fiber content on the nutrition facts label of any prepackaged food. Look for foods that contain 5 g of fiber or more per serving.  Talk with a diet and nutrition specialist (dietitian) if you have questions about specific foods that are recommended or not recommended for your medical condition, especially if those foods are not listed below.  Gradually increase how much fiber you  consume. If you increase your intake of dietary fiber too quickly, you may have bloating, cramping, or gas.  Drink plenty of water. Water helps you to digest fiber. What are tips for following this plan?  Eat a wide variety of high-fiber foods.  Make sure that half of the grains that you eat each day are whole grains.  Eat breads and cereals that are made with whole-grain flour instead of refined flour or white flour.  Eat brown rice, bulgur wheat, or millet instead of white rice.  Start the day with a breakfast that is high in fiber, such as a cereal that contains 5 g of fiber or more per serving.  Use beans in place of meat in soups, salads, and pasta dishes.  Eat high-fiber snacks, such as berries, raw vegetables, nuts, and popcorn.  Choose whole fruits and vegetables instead of processed forms like juice or sauce. What foods can I eat?  Fruits Berries. Pears. Apples. Oranges. Avocado. Prunes and raisins. Dried figs. Vegetables Sweet potatoes. Spinach. Kale. Artichokes. Cabbage. Broccoli. Cauliflower. Green peas. Carrots. Squash. Grains Whole-grain breads. Multigrain cereal. Oats and oatmeal. Brown rice. Barley. Bulgur wheat. [place]. Quinoa. Bran muffins. Popcorn. Rye wafer crackers. Meats and other proteins Navy, kidney, and pinto beans. Soybeans. Split peas. Lentils. Nuts and seeds. Dairy Fiber-fortified yogurt. Beverages Fiber-fortified soy milk. Fiber-fortified orange juice. Other foods Fiber bars. The items listed above may not be a complete list of recommended foods and beverages. Contact a dietitian for more options. What foods are not recommended? Fruits Fruit juice. Cooked, strained fruit. Vegetables Fried potatoes. Canned vegetables. Well-cooked vegetables. Grains White bread. Pasta made with refined flour. White rice. Meats and other proteins Fatty cuts of meat. Fried chicken or fried fish. Dairy Milk. Yogurt. Cream cheese. Sour cream. Fats and  oils Butters. Beverages Soft drinks. Other foods Cakes and pastries. The items listed above may not be a complete list of foods and beverages to avoid. Contact a dietitian for more information. Summary  Fiber is a type of carbohydrate. It is found in fruits, vegetables, whole grains, and beans.  There are many health benefits of eating a high-fiber diet, such as preventing constipation, lowering blood cholesterol, helping with weight loss, and reducing your risk of heart disease,  diabetes, and certain cancers.  Gradually increase your intake of fiber. Increasing too fast can result in cramping, bloating, and gas. Drink plenty of water while you increase your fiber.  The best sources of fiber include whole fruits and vegetables, whole grains, nuts, seeds, and beans. This information is not intended to replace advice given to you by your health care provider. Make sure you discuss any questions you have with your health care provider. Document Revised: 08/23/2017 Document Reviewed: 08/23/2017 Elsevier Patient Education  [place] Endoscopy, Adult Upper endoscopy is a procedure to look inside the upper GI (gastrointestinal) tract. The upper GI tract is made up of:  The part of the body that moves food from your mouth to your stomach (esophagus).  The stomach.  The first part of your small intestine (duodenum). This procedure is also called esophagogastroduodenoscopy (EGD) or gastroscopy. In this procedure, your health care provider passes a thin, flexible tube (endoscope) through your mouth and down your esophagus into your stomach. A small camera is attached to the end of the tube. Images from the camera appear on a monitor in the exam room. During this procedure, your health care provider may also remove a small piece of tissue to be sent to a lab and examined under a microscope (biopsy). Your health care provider may do an upper endoscopy to diagnose cancers of the upper  GI tract. You may also have this procedure to find the cause of other conditions, such as:  Stomach pain.  Heartburn.  Pain or problems when swallowing.  Nausea and vomiting.  Stomach bleeding.  Stomach ulcers. Tell a health care provider about:  Any allergies you have.  All medicines you are taking, including vitamins, herbs, eye drops, creams, and over-the-counter medicines.  Any problems you or family members have had with anesthetic medicines.  Any blood disorders you have.  Any surgeries you have had.  Any medical conditions you have.  Whether you are pregnant or may be pregnant. What are the risks? Generally, this is a safe procedure. However, problems may occur, including:  Infection.  Bleeding.  Allergic reactions to medicines.  A tear or hole (perforation) in the esophagus, stomach, or duodenum. What happens before the procedure? Staying hydrated Follow instructions from your health care provider about hydration, which may include:  Up to 2 hours before the procedure - you may continue to drink clear liquids, such as water, clear fruit juice, black coffee, and plain tea.  Eating and drinking restrictions Follow instructions from your health care provider about eating and drinking, which may include:  8 hours before the procedure - stop eating heavy meals or foods, such as meat, fried foods, or fatty foods.  6 hours before the procedure - stop eating light meals or foods, such as toast or cereal.  6 hours before the procedure - stop drinking milk or drinks that contain milk.  2 hours before the procedure - stop drinking clear liquids. Medicines Ask your health care provider about:  Changing or stopping your regular medicines. This is especially important if you are taking diabetes medicines or blood thinners.  Taking medicines such as aspirin and ibuprofen. These medicines can thin your blood. Do not take these medicines unless your health care  provider tells you to take them.  Taking over-the-counter medicines, vitamins, herbs, and supplements. General instructions  Plan to have someone take you home from the hospital or clinic.  If you will be going home right after the procedure,  plan to have someone with you for 24 hours.  Ask your health care provider what steps will be taken to help prevent infection. What happens during the procedure?   An IV will be inserted into one of your veins.  You may be given one or more of the following: ? A medicine to help you relax (sedative). ? A medicine to numb the throat (local anesthetic).  You will lie on your left side on an exam table.  Your health care provider will pass the endoscope through your mouth and down your esophagus.  Your health care provider will use the scope to check the inside of your esophagus, stomach, and duodenum. Biopsies may be taken.  The endoscope will be removed. The procedure may vary among health care providers and hospitals. What happens after the procedure?  Your blood pressure, heart rate, breathing rate, and blood oxygen level will be monitored until you leave the hospital or clinic.  Do not drive for 24 hours if you were given a sedative during your procedure.  When your throat is no longer numb, you may be given some fluids to drink.  It is up to you to get the results of your procedure. Ask your health care provider, or the department that is doing the procedure, when your results will be ready. Summary  Upper endoscopy is a procedure to look inside the upper GI tract.  During the procedure, an IV will be inserted into one of your veins. You may be given a medicine to help you relax.  A medicine will be used to numb your throat.  The endoscope will be passed through your mouth and down your esophagus. This information is not intended to replace advice given to you by your health care provider. Make sure you discuss any questions you  have with your health care provider. Document Revised: 04/13/2018 Document Reviewed: 03/21/2018 Elsevier Patient Education  [place].  Colonoscopy, Adult, Care After This sheet gives you information about how to care for yourself after your procedure. Your doctor may also give you more specific instructions. If you have problems or questions, call your doctor. What can I expect after the procedure? After the procedure, it is common to have:  A small amount of blood in your poop (stool) for 24 hours.  Some gas.  Mild cramping or bloating in your belly (abdomen). Follow these instructions at home: Eating and drinking   Drink enough fluid to keep your pee (urine) pale yellow.  Follow instructions from your doctor about what you cannot eat or drink.  Return to your normal diet as told by your doctor. Avoid heavy or fried foods that are hard to digest. Activity  Rest as told by your doctor.  Do not sit for a long time without moving. Get up to take short walks every 1-2 hours. This is important. Ask for help if you feel weak or unsteady.  Return to your normal activities as told by your doctor. Ask your doctor what activities are safe for you. To help cramping and bloating:   Try walking around.  Put heat on your belly as told by your doctor. Use the heat source that your doctor recommends, such as a moist heat pack or a heating pad. ? Put a towel between your skin and the heat source. ? Leave the heat on for 20-30 minutes. ? Remove the heat if your skin turns bright red. This is very important if you are unable to feel pain,  heat, or cold. You may have a greater risk of getting burned. General instructions  For the first 24 hours after the procedure: ? Do not drive or use machinery. ? Do not sign important documents. ? Do not drink alcohol. ? Do your daily activities more slowly than normal. ? Eat foods that are soft and easy to digest.  Take over-the-counter or  prescription medicines only as told by your doctor.  Keep all follow-up visits as told by your doctor. This is important. Contact a doctor if:  You have blood in your poop 2-3 days after the procedure. Get help right away if:  You have more than a small amount of blood in your poop.  You see large clumps of tissue (blood clots) in your poop.  Your belly is swollen.  You feel like you may vomit (nauseous).  You vomit.  You have a fever.  You have belly pain that gets worse, and medicine does not help your pain. Summary  After the procedure, it is common to have a small amount of blood in your poop. You may also have mild cramping and bloating in your belly.  For the first 24 hours after the procedure, do not drive or use machinery, do not sign important documents, and do not drink alcohol.  Get help right away if you have a lot of blood in your poop, feel like you may vomit, have a fever, or have more belly pain. This information is not intended to replace advice given to you by your health care provider. Make sure you discuss any questions you have with your health care provider. Document Revised: 05/15/2019 Document Reviewed: 05/15/2019 Elsevier Patient Education  [place].

## 2020-07-03 NOTE — H&P (Signed)
Cassidy [name] is an 49 y.o. female.   Chief Complaint: Patient is here for esophagogastroduodenoscopy and colonoscopy. HPI: Patient is 49 year old female with more than 20-year history of GERD who is on a PPI and famotidine for breakthrough symptoms who is undergoing diagnostic EGD.  She says she is to have dysphagia but since she quit eating cashews she has not had any problems.  In the past she has been on various PPIs.  She had severe diarrhea with omeprazole.  She is watching her diet.  She also has chronic diarrhea.  She says when she was in her 55s she she was constipated.  On some days she can have as many as 10 or 12 stools.  She notices blood every now and then but she knows she has hemorrhoids.  She has good appetite and her weight has been stable. Family history is negative for celiac disease or IBD. Paternal grandfather had colon carcinoma at age 22 and died at 10 of unrelated causes.  He acquired MRSA.  Past Medical History:  Diagnosis Date  . ANEMIA-IRON DEFICIENCY 09/20/2007  . ASTHMA 09/20/2007  . COMMON MIGRAINE 01/02/2009  . FEVER UNSPECIFIED 01/17/2008  . FROZEN RIGHT SHOULDER 07/10/2009  . GERD 01/02/2009  . GLUCOSE INTOLERANCE 01/02/2009  . Headache(784.0) 09/20/2007  . Heart palpitations    Hx: of with low potassium levels  . Hematuria 07/20/2011  . HYPERTENSION 01/02/2009  . Impaired glucose tolerance 07/17/2011  . LATERAL EPICONDYLITIS, RIGHT 06/05/2009  . Other and unspecified hyperlipidemia 01/15/2013  . PONV (postoperative nausea and vomiting)   . ROTATOR CUFF SYNDROME, RIGHT 05/27/2009  . SHOULDER PAIN, RIGHT 05/27/2009    Past Surgical History:  Procedure Laterality Date  . ABDOMINAL HYSTERECTOMY    . FRACTURE SURGERY     Hx: of left ankle  . HARDWARE REMOVAL Left 03/10/2013   Procedure: HARDWARE REMOVAL;  Surgeon: Newt Minion, MD;  Location: [facility];  Service: Orthopedics;  Laterality: Left;  Left Ankle Removal Medial Malleolus Screws  . HYSTEROTOMY  2017  . s/p epicondylar  release    . s/p LEEP procedure 2000 for abnormal pap     Normal since then  . s/p uterine ablation  2007  . TUBAL LIGATION  1955    Family History  Problem Relation Age of Onset  . Hypertension Father   . Hyperlipidemia Father   . Diabetes Father   . Colon cancer Paternal Grandfather   . Diabetes Mother    Social History:  reports that she has never smoked. She has never used smokeless tobacco. She reports that she does not drink alcohol and does not use drugs.  Allergies:  Allergies  Allergen Reactions  . Omeprazole   . Augmentin [Amoxicillin-Pot Clavulanate]   . Cleocin [Clindamycin Hcl] Diarrhea and Nausea And Vomiting  . Frovatriptan Other (See Comments)    parasthesia  . Gabapentin (Once-Daily)   . Montelukast Sodium Other (See Comments)    parasthesia  . Morphine And Related Nausea And Vomiting    And dizzy, and "feeling terrible"  . Propranolol Hcl     unknown  . Singulair [Montelukast]   . Sumatriptan Other (See Comments)    Masked asthma  . Vancomycin Other (See Comments)    Had reaction during recovery, maybe hives or rash  . Benzoin Rash    And blisters    Medications Prior to Admission  Medication Sig Dispense Refill  . albuterol (PROVENTIL HFA;VENTOLIN HFA) 108 (90 BASE) MCG/ACT inhaler Inhale 2 puffs into  the lungs every 6 (six) hours as needed for wheezing. 3 Inhaler 3  . Ascorbic Acid (VITAMIN C) 1000 MG tablet Take 1,000 mg by mouth daily.    . cetirizine (ZYRTEC) 10 MG tablet Take 10 mg by mouth daily.     . Cholecalciferol (VITAMIN D) 125 MCG (5000 UT) CAPS Take 5,000 Units by mouth daily.    Marland Kitchen dexlansoprazole (DEXILANT) 60 MG capsule Take 1 capsule (60 mg total) by mouth daily. 90 capsule 3  . diazepam (VALIUM) 2 MG tablet Take 2 mg by mouth at bedtime as needed for anxiety or sleep.    Marland Kitchen dicyclomine (BENTYL) 10 MG capsule Take 1 capsule (10 mg total) by mouth 2 (two) times daily as needed (diarrhea abd pain). 90 capsule 1  . fexofenadine  (ALLEGRA) 180 MG tablet Take 1 tablet (180 mg total) by mouth every evening. (Patient taking differently: Take 180 mg by mouth daily as needed for allergies. ) 180 tablet 2  . hydrOXYzine (ATARAX/VISTARIL) 25 MG tablet Take 25 mg by mouth 3 (three) times daily as needed for itching. As needed     . Liraglutide -Weight Management (SAXENDA [place]) Inject 1.8 mg into the skin daily.     Marland Kitchen losartan-hydrochlorothiazide (HYZAAR) 100-25 MG tablet Take 1 tablet by mouth daily.    . magnesium gluconate (MAGONATE) 500 MG tablet Take 500 mg by mouth every other day.    . meclizine (ANTIVERT) 12.5 MG tablet Take 12.5 mg by mouth 3 (three) times daily as needed (vertigo).    . Melatonin 10 MG TABS Take 10 mg by mouth every Monday, Tuesday, Wednesday, Thursday, and Friday.     . meloxicam (MOBIC) 15 MG tablet Take 15 mg by mouth daily as needed for pain (headache).     . Misc Natural Products (APPLE CIDER VINEGAR DIET PO) Take 1 capsule by mouth daily.     . Multiple Vitamin (MULTIVITAMIN WITH MINERALS) TABS Take 1 tablet by mouth every evening.    . rizatriptan (MAXALT) 10 MG tablet Take 1 tablet (10 mg total) by mouth daily as needed for migraine. 1 tablet by mouth for Migraine.  May repeat in 2 hours (no more than 2 in 24 hours or 2-3 days a week). 10 tablet 5  . ARMODAFINIL PO Take by mouth. 50 once a day (Patient not taking: Reported on 06/24/2020)    . COCONUT OIL PO Take 1 capsule by mouth every evening. (Patient not taking: Reported on 05/13/2020)    . cyclobenzaprine (FLEXERIL) 10 MG tablet Take 10 mg by mouth daily as needed (tension headaches). (Patient not taking: Reported on 06/24/2020)    . hydrochlorothiazide (HYDRODIURIL) 25 MG tablet TAKE 1 TABLET EVERY EVENING (Patient not taking: Reported on 06/24/2020) 90 tablet 0  . HYDROcodone-acetaminophen (NORCO) 5-325 MG per tablet Take 1 tablet by mouth every 6 (six) hours as needed for pain. (Patient not taking: Reported on 05/13/2020) 30 tablet 0  .  HYDROcodone-homatropine (HYCODAN) 5-1.5 MG/5ML syrup Take 5 mLs by mouth every 6 (six) hours as needed for cough. (Patient not taking: Reported on 05/13/2020) 120 mL 1  . lidocaine (XYLOCAINE) 5 % ointment Apply 1 application topically as needed. (Patient not taking: Reported on 06/24/2020)    . magnesium citrate SOLN Take 1 Bottle by mouth once. 500mg  once a day (Patient not taking: Reported on 06/24/2020)    . pantoprazole (PROTONIX) 40 MG tablet Take 40 mg by mouth every evening. (Patient not taking: Reported on 05/13/2020)    .  potassium chloride (K-DUR) 10 MEQ tablet Take 10 mEq by mouth every evening. (Patient not taking: Reported on 05/13/2020)    . promethazine (PHENERGAN) 25 MG tablet Take 25 mg by mouth 2 (two) times daily as needed for nausea. (Patient not taking: Reported on 06/24/2020)    . Vitamin D, Cholecalciferol, 25 MCG (1000 UT) TABS Take by mouth. 5,000units once a day (Patient not taking: Reported on 06/24/2020)      Results for orders placed or performed during the hospital encounter of 07/01/20 (from the past 48 hour(s))  SARS CORONAVIRUS 2 (TAT 6-24 HRS) Nasopharyngeal Nasopharyngeal Swab     Status: None   Collection Time: 07/01/20  2:02 PM   Specimen: Nasopharyngeal Swab  Result Value Ref Range   SARS Coronavirus 2 NEGATIVE NEGATIVE    Comment: (NOTE) SARS-CoV-2 target nucleic acids are NOT DETECTED.  The SARS-CoV-2 RNA is generally detectable in upper and lower respiratory specimens during the acute phase of infection. Negative results do not preclude SARS-CoV-2 infection, do not rule out co-infections with other pathogens, and should not be used as the sole basis for treatment or other patient management decisions. Negative results must be combined with clinical observations, patient history, and epidemiological information. The expected result is Negative.  Fact Sheet for Patients: SugarRoll.be  Fact Sheet for Healthcare  Providers: https://www.woods-mathews.com/  This test is not yet approved or cleared by the [place] FDA and  has been authorized for detection and/or diagnosis of SARS-CoV-2 by FDA under an Emergency Use Authorization (EUA). This EUA will remain  in effect (meaning this test can be used) for the duration of the COVID-19 declaration under Se ction 564(b)(1) of the Act, 21 U.S.C. section 360bbb-3(b)(1), unless the authorization is terminated or revoked sooner.  Performed at [facility], [place] [address], [place], [place] [address]   CBC with Differential/Platelet     Status: None   Collection Time: 07/01/20  2:09 PM  Result Value Ref Range   WBC 9.3 4.0 - 10.5 K/uL   RBC 4.24 3.87 - 5.11 MIL/uL   Hemoglobin 12.2 12.0 - 15.0 g/dL   HCT 37.1 36 - 46 %   MCV 87.5 80.0 - 100.0 fL   MCH 28.8 26.0 - 34.0 pg   MCHC 32.9 30.0 - 36.0 g/dL   RDW 12.2 11.5 - 15.5 %   Platelets 365 150 - 400 K/uL   nRBC 0.0 0.0 - 0.2 %   Neutrophils Relative % 62 %   Neutro Abs 5.7 1.7 - 7.7 K/uL   Lymphocytes Relative 30 %   Lymphs Abs 2.8 0.7 - 4.0 K/uL   Monocytes Relative 4 %   Monocytes Absolute 0.4 0 - 1 K/uL   Eosinophils Relative 4 %   Eosinophils Absolute 0.3 0 - 0 K/uL   Basophils Relative 0 %   Basophils Absolute 0.0 0 - 0 K/uL   Immature Granulocytes 0 %   Abs Immature Granulocytes 0.02 0.00 - 0.07 K/uL    Comment: Performed at [facility], [address] [place],  [address]  Basic metabolic panel     Status: Abnormal   Collection Time: 07/01/20  2:09 PM  Result Value Ref Range   Sodium 138 135 - 145 mmol/L   Potassium 3.5 3.5 - 5.1 mmol/L   Chloride 102 98 - 111 mmol/L   CO2 24 22 - 32 mmol/L   Glucose, Bld 118 (H) 70 - 99 mg/dL    Comment: Glucose reference range applies only  to samples taken after fasting for at least 8 hours.   BUN 16 6 - 20 mg/dL   Creatinine, Ser 0.85 0.44 - 1.00 mg/dL   Calcium 8.9 8.9 - 10.3 mg/dL   GFR calc non Af Amer >60 >60  mL/min   GFR calc Af Amer >60 >60 mL/min   Anion gap 12 5 - 15    Comment: Performed at [facility], [address], [place], [place] [address]   No results found.  Review of Systems  Blood pressure 136/72, pulse 77, temperature 98.5 F (36.9 C), temperature source Oral, resp. rate 18, height 5' 7.5" (1.715 m), weight 124.3 kg, last menstrual period 02/06/2013, SpO2 100 %. Physical Exam HENT:     Mouth/Throat:     Mouth: Mucous membranes are moist.     Pharynx: Oropharynx is clear.  Eyes:     General: No scleral icterus.    Conjunctiva/sclera: Conjunctivae normal.  Cardiovascular:     Rate and Rhythm: Normal rate and regular rhythm.     Heart sounds: Normal heart sounds. No murmur heard.   Pulmonary:     Effort: Pulmonary effort is normal.     Breath sounds: Normal breath sounds.  Abdominal:     Comments: Abdomen is full.  She has Pfannenstiel scar.  On palpation is soft and nontender with organomegaly or masses.  Musculoskeletal:        General: No swelling.     Cervical back: Neck supple.  Lymphadenopathy:     Cervical: No cervical adenopathy.  Skin:    General: Skin is warm and dry.  Neurological:     Mental Status: She is alert.      Assessment/Plan Chronic GERD. Connick diarrhea. Elastic esophagogastroduodenoscopy and colonoscopy.  Hildred Laser, MD 07/03/2020, 7:24 AM

## 2020-07-03 NOTE — Transfer of Care (Signed)
Immediate Anesthesia Transfer of Care Note  Patient: Cassidy [name]  Procedure(s) Performed: COLONOSCOPY WITH PROPOFOL (N/A ) ESOPHAGOGASTRODUODENOSCOPY (EGD) WITH PROPOFOL (N/A ) BIOPSY POLYPECTOMY  Patient Location: PACU  Anesthesia Type:General  Level of Consciousness: awake, alert , oriented and patient cooperative  Airway & Oxygen Therapy: Patient Spontanous Breathing  Post-op Assessment: Report given to RN, Post -op Vital signs reviewed and stable and Patient moving all extremities X 4  Post vital signs: Reviewed and stable  Last Vitals:  Vitals Value Taken Time  BP 114/78 07/03/20 0821  Temp    Pulse 95 07/03/20 0822  Resp 16 07/03/20 0822  SpO2 100 % 07/03/20 0822  Vitals shown include unvalidated device data.  Last Pain:  Vitals:   07/03/20 0736  TempSrc:   PainSc: 0-No pain      Patients Stated Pain Goal: 9 (73/66/81 5947)  Complications: No complications documented.

## 2020-07-04 LAB — SURGICAL PATHOLOGY

## 2020-07-08 ENCOUNTER — Other Ambulatory Visit (INDEPENDENT_AMBULATORY_CARE_PROVIDER_SITE_OTHER): Payer: Self-pay | Admitting: Internal Medicine

## 2020-07-08 MED ORDER — NYSTATIN-TRIAMCINOLONE 100000-0.1 UNIT/GM-% EX CREA: 1.0000 "application " | TOPICAL_CREAM | Freq: Every evening | CUTANEOUS | 1 refills | Status: DC | PRN

## 2020-07-09 ENCOUNTER — Encounter (HOSPITAL_COMMUNITY): Payer: Self-pay | Admitting: Internal Medicine

## 2020-07-25 ENCOUNTER — Encounter (INDEPENDENT_AMBULATORY_CARE_PROVIDER_SITE_OTHER): Payer: Self-pay | Admitting: Internal Medicine

## 2020-11-18 ENCOUNTER — Ambulatory Visit: Payer: 59 | Admitting: Internal Medicine

## 2020-12-03 ENCOUNTER — Other Ambulatory Visit: Payer: Self-pay

## 2020-12-05 ENCOUNTER — Ambulatory Visit: Payer: 59 | Admitting: Internal Medicine

## 2020-12-05 ENCOUNTER — Other Ambulatory Visit: Payer: Self-pay

## 2020-12-05 ENCOUNTER — Encounter: Payer: Self-pay | Admitting: Internal Medicine

## 2020-12-05 VITALS — BP 116/80 | HR 78 | Ht 67.5 in | Wt 273.2 lb

## 2020-12-05 DIAGNOSIS — E063 Autoimmune thyroiditis: Secondary | ICD-10-CM | POA: Diagnosis not present

## 2020-12-05 DIAGNOSIS — E0590 Thyrotoxicosis, unspecified without thyrotoxic crisis or storm: Secondary | ICD-10-CM

## 2020-12-05 LAB — TSH: TSH: 0.29 u[IU]/mL — ABNORMAL LOW (ref 0.35–4.50)

## 2020-12-05 LAB — T4, FREE: Free T4: 0.78 ng/dL (ref 0.60–1.60)

## 2020-12-05 MED ORDER — METHIMAZOLE 5 MG PO TABS: 2.5000 mg | ORAL_TABLET | Freq: Every day | ORAL | 1 refills | Status: DC

## 2020-12-05 NOTE — Progress Notes (Signed)
Name: Cassidy [name]  MRN/ DOB: [id number], 1971-01-31    Age/ Sex: 50 y.o., female    PCP: Beola Cord, [place]   Reason for Endocrinology Evaluation: Elevated Anti-TPO Ab's      Date of Initial Endocrinology Evaluation: 12/05/2020     HPI: Cassidy [name] is a 50 y.o. female with a past medical history of Anemia, Asthma, migraine headaches and HTN. The patient presented for initial endocrinology clinic visit on 12/05/2020 for consultative assistance with her elevated Anti-TPO Ab's.   She was found to have elevated Anti-TPO Ab at 147 IU/mL ( reference 0-34) and elevated Tg Ab's 642.9 IU/mL ( reference 0.0-0.9). Of note, the pt has normal total T3 at 143 ng/dL but low TSH at 0.26 uIU/mL  And normal FT4 at 1.09 ng/dL .  This was checked during routine check up   Pt with fatigue , arthralgias and eye itching - follows with  An ophthalmologist  Skin is dry, has alternating constipation/diarrhea  Denies depression or anxiety but has hair loss  Weight has been stable - tried [place]  Not on Biotin   Mother had questionable thyromegaly , cousin with Grave's disease   Thyroid ultrasound showed questionable nodules   S/P hysterectomy   HISTORY:  Past Medical History:  Past Medical History:  Diagnosis Date  . ANEMIA-IRON DEFICIENCY 09/20/2007  . ASTHMA 09/20/2007  . COMMON MIGRAINE 01/02/2009  . FEVER UNSPECIFIED 01/17/2008  . FROZEN RIGHT SHOULDER 07/10/2009  . GERD 01/02/2009  . GLUCOSE INTOLERANCE 01/02/2009  . Headache(784.0) 09/20/2007  . Heart palpitations    Hx: of with low potassium levels  . Hematuria 07/20/2011  . HYPERTENSION 01/02/2009  . Impaired glucose tolerance 07/17/2011  . LATERAL EPICONDYLITIS, RIGHT 06/05/2009  . Other and unspecified hyperlipidemia 01/15/2013  . PONV (postoperative nausea and vomiting)   . ROTATOR CUFF SYNDROME, RIGHT 05/27/2009  . SHOULDER PAIN, RIGHT 05/27/2009   Past Surgical History:  Past Surgical History:  Procedure Laterality Date  . ABDOMINAL  HYSTERECTOMY    . BIOPSY  07/03/2020   Procedure: BIOPSY;  Surgeon: Rogene Houston, MD;  [facility];  Service: Endoscopy;;  . COLONOSCOPY WITH PROPOFOL N/A 07/03/2020   Procedure: COLONOSCOPY WITH PROPOFOL;  Surgeon: Rogene Houston, MD;  [facility];  Service: Endoscopy;  Laterality: N/A;  730  . ESOPHAGOGASTRODUODENOSCOPY (EGD) WITH PROPOFOL N/A 07/03/2020   Procedure: ESOPHAGOGASTRODUODENOSCOPY (EGD) WITH PROPOFOL;  Surgeon: Rogene Houston, MD;  [facility];  Service: Endoscopy;  Laterality: N/A;  . FRACTURE SURGERY     Hx: of left ankle  . HARDWARE REMOVAL Left 03/10/2013   Procedure: HARDWARE REMOVAL;  Surgeon: Newt Minion, MD;  Location: [facility];  Service: Orthopedics;  Laterality: Left;  Left Ankle Removal Medial Malleolus Screws  . HYSTEROTOMY  2017  . POLYPECTOMY  07/03/2020   Procedure: POLYPECTOMY;  Surgeon: Rogene Houston, MD;  [facility];  Service: Endoscopy;;  . s/p epicondylar release    . s/p LEEP procedure 2000 for abnormal pap     Normal since then  . s/p uterine ablation  2007  . TUBAL LIGATION  1955      Social History:  reports that she has never smoked. She has never used smokeless tobacco. She reports that she does not drink alcohol and does not use drugs.  Family History: family history includes Colon cancer in her paternal grandfather; Diabetes in her father and mother; Hyperlipidemia in her father; Hypertension  in her father.   HOME MEDICATIONS: Allergies as of 12/05/2020      Reactions   Omeprazole    Augmentin [amoxicillin-pot Clavulanate]    Cleocin [clindamycin Hcl] Diarrhea, Nausea And Vomiting   Frovatriptan Other (See Comments)   parasthesia   Gabapentin (once-daily)    Montelukast Sodium Other (See Comments)   parasthesia   Morphine And Related Nausea And Vomiting   And dizzy, and "feeling terrible"   Propranolol Hcl    unknown   Singulair [montelukast]    Sumatriptan Other (See Comments)    Masked asthma   Vancomycin Other (See Comments)   Had reaction during recovery, maybe hives or rash   Benzoin Rash   And blisters      Medication List       Accurate as of December 05, 2020  9:27 AM. If you have any questions, ask your nurse or doctor.        STOP taking these medications   losartan-hydrochlorothiazide 100-25 MG tablet Commonly known as: HYZAAR Stopped by: Dorita Sciara, MD     TAKE these medications   albuterol 108 (90 Base) MCG/ACT inhaler Commonly known as: VENTOLIN HFA Inhale 2 puffs into the lungs every 6 (six) hours as needed for wheezing.   APPLE CIDER VINEGAR DIET PO Take 1 capsule by mouth daily.   cetirizine 10 MG tablet Commonly known as: ZYRTEC Take 10 mg by mouth daily.   dexlansoprazole 60 MG capsule Commonly known as: Dexilant Take 1 capsule (60 mg total) by mouth daily.   diazepam 2 MG tablet Commonly known as: VALIUM Take 2 mg by mouth at bedtime as needed for anxiety or sleep.   dicyclomine 10 MG capsule Commonly known as: BENTYL Take 1 capsule (10 mg total) by mouth 2 (two) times daily as needed (diarrhea abd pain).   fexofenadine 180 MG tablet Commonly known as: ALLEGRA Take 1 tablet (180 mg total) by mouth every evening. What changed:   when to take this  reasons to take this   hydrochlorothiazide 25 MG tablet Commonly known as: HYDRODIURIL Take 25 mg by mouth daily.   hydrOXYzine 25 MG tablet Commonly known as: ATARAX/VISTARIL Take 25 mg by mouth 3 (three) times daily as needed for itching. As needed   losartan 100 MG tablet Commonly known as: COZAAR Take 100 mg by mouth daily.   magnesium gluconate 500 MG tablet Commonly known as: MAGONATE Take 500 mg by mouth every other day.   meclizine 12.5 MG tablet Commonly known as: ANTIVERT Take 12.5 mg by mouth 3 (three) times daily as needed (vertigo).   Melatonin 10 MG Tabs Take 10 mg by mouth every Monday, Tuesday, Wednesday, Thursday, and Friday.    meloxicam 15 MG tablet Commonly known as: MOBIC Take 1 tablet (15 mg total) by mouth daily as needed for pain (headache).   multivitamin with minerals Tabs tablet Take 1 tablet by mouth every evening.   nystatin-triamcinolone cream Commonly known as: MYCOLOG II Apply 1 application topically at bedtime as needed.   promethazine 25 MG tablet Commonly known as: PHENERGAN Take 25 mg by mouth 2 (two) times daily as needed for nausea.   rizatriptan 10 MG tablet Commonly known as: MAXALT Take 1 tablet (10 mg total) by mouth daily as needed for migraine. 1 tablet by mouth for Migraine.  May repeat in 2 hours (no more than 2 in 24 hours or 2-3 days a week).   SAXENDA [place] Inject 1.8 mg into the skin daily.  vitamin C 1000 MG tablet Take 1,000 mg by mouth daily.   Vitamin D 125 MCG (5000 UT) Caps Take 5,000 Units by mouth daily.         REVIEW OF SYSTEMS: A comprehensive ROS was conducted with the patient and is negative except as per HPI    OBJECTIVE:  VS: BP 116/80   Pulse 78   Ht 5' 7.5" (1.715 m)   Wt 273 lb 4 oz (123.9 kg)   LMP 02/06/2013   SpO2 99%   BMI 42.17 kg/m    Wt Readings from Last 3 Encounters:  12/05/20 273 lb 4 oz (123.9 kg)  07/03/20 274 lb 0.5 oz (124.3 kg)  07/01/20 274 lb (124.3 kg)     EXAM: General: Pt appears well and is in NAD  Neck: General: Supple without adenopathy. Thyroid: Thyroid size normal.  No goiter or nodules appreciated.   Lungs: Clear with good BS bilat with no rales, rhonchi, or wheezes  Heart: Auscultation: RRR.  Abdomen: Normoactive bowel sounds, soft, nontender, without masses or organomegaly palpable  Extremities:  BL LE: No pretibial edema normal ROM and strength.  Skin: Hair: Texture and amount normal with gender appropriate distribution Skin Inspection: No rashes Skin Palpation: Skin temperature, texture, and thickness normal to palpation  Neuro: Cranial nerves: II - XII grossly intact  Motor: Normal strength  throughout DTRs: 2+ and symmetric in UE without delay in relaxation phase  Mental Status: Judgment, insight: Intact Orientation: Oriented to time, place, and person Mood and affect: No depression, anxiety, or agitation     DATA REVIEWED:  Results for Ranieri, LUKISHA STRATMAN (MRN [id number]) as of 12/05/2020 13:29  Ref. Range 12/05/2020 09:53  TSH Latest Ref Range: 0.35 - 4.50 uIU/mL 0.29 (L)  T4,Free(Direct) Latest Ref Range: 0.60 - 1.60 ng/dL 0.78     08/2020 TSH 0.36  11/2020 TSH 0.36 ( 0.45-4.5)   ASSESSMENT/PLAN/RECOMMENDATIONS:  1. Hashimoto's Disease:  - Pt with non-specific symptoms  - TSH not elevated but rather low  -I explained to the patient that Hashimoto's Disease is an autoimmune - mediated destruction of the thyroid gland. The usual course of Hashimoto's thyroiditis is the gradual loss of thyroid function.     2. Subclinical Hyperthyroidism :  - Discussed Graves' disease ( Hashi-toxi ) in this matter given low TSH in the setting of elevated Anti-TPO Ab , we also discussed essay interference  - She is willing to try thionamide therapy if TSH continues to be low , given symptoms  - Repeat TSH continues to be low, will start methimazole as below   - We discussed with pt the benefits of methimazole in the Tx of hyperthyroidism, as well as the possible side effects/complications of anti-thyroid drug Tx (specifically detailing the rare, but serious side effect of agranulocytosis). She was informed of need for regular thyroid function monitoring while on methimazole to ensure appropriate dosage without over-treatment. As well, we discussed the possible side effects of methimazole including the chance of rash, the small chance of liver irritation/juandice and the <=1 in 300-400 chance of sudden onset agranulocytosis.  We discussed importance of going to ED promptly (and stopping methimazole) if shewere to develop significant fever with severe sore throat of other evidence of acute  infection.     Medications : Methimazole 5 mg, half a tablet  daily     F/U in 3 months  Labs in 6 weeks   Signed electronically by: Mack Guise, MD  [facility] Endocrinology  [facility] [address] [place] [address], [place], [place] [address] Phone: [phone] FAX: [phone]   CC: Beola Cord, [address] [place] [address] Phone: [phone] Fax: [phone]   Return to Endocrinology clinic as below: Future Appointments  Date Time Provider [place]  12/05/2020  9:30 AM Ladine Kiper, Melanie Crazier, MD [facility] None  08/05/2021 10:00 AM Rehman, Mechele Dawley, MD [facility] None

## 2020-12-06 ENCOUNTER — Telehealth: Payer: Self-pay | Admitting: Family Medicine

## 2020-12-06 NOTE — Telephone Encounter (Signed)
Pt had all Pfizer vaccines and the booster.

## 2020-12-09 LAB — T3: T3, Total: 138 ng/dL (ref 76–181)

## 2020-12-09 LAB — TRAB (TSH RECEPTOR BINDING ANTIBODY): TRAB: <1 IU/L (ref <=2.00)

## 2021-03-05 ENCOUNTER — Ambulatory Visit: Payer: 59 | Admitting: Internal Medicine

## 2021-03-06 ENCOUNTER — Encounter: Payer: Self-pay | Admitting: Internal Medicine

## 2021-03-06 ENCOUNTER — Ambulatory Visit: Payer: 59 | Admitting: Internal Medicine

## 2021-03-06 ENCOUNTER — Other Ambulatory Visit: Payer: Self-pay

## 2021-03-06 VITALS — BP 124/82 | HR 82 | Ht 67.5 in | Wt 273.2 lb

## 2021-03-06 DIAGNOSIS — R635 Abnormal weight gain: Secondary | ICD-10-CM | POA: Diagnosis not present

## 2021-03-06 DIAGNOSIS — E0590 Thyrotoxicosis, unspecified without thyrotoxic crisis or storm: Secondary | ICD-10-CM | POA: Diagnosis not present

## 2021-03-06 LAB — CBC WITH DIFFERENTIAL/PLATELET
Basophils Absolute: 0.1 10*3/uL (ref 0.0–0.1)
Basophils Relative: 0.6 % (ref 0.0–3.0)
Eosinophils Absolute: 0.3 10*3/uL (ref 0.0–0.7)
Eosinophils Relative: 3.5 % (ref 0.0–5.0)
HCT: 38.3 % (ref 36.0–46.0)
Hemoglobin: 13 g/dL (ref 12.0–15.0)
Lymphocytes Relative: 27 % (ref 12.0–46.0)
Lymphs Abs: 2.3 10*3/uL (ref 0.7–4.0)
MCHC: 33.9 g/dL (ref 30.0–36.0)
MCV: 83.4 fl (ref 78.0–100.0)
Monocytes Absolute: 0.4 10*3/uL (ref 0.1–1.0)
Monocytes Relative: 4.4 % (ref 3.0–12.0)
Neutro Abs: 5.6 10*3/uL (ref 1.4–7.7)
Neutrophils Relative %: 64.5 % (ref 43.0–77.0)
Platelets: 355 10*3/uL (ref 150.0–400.0)
RBC: 4.59 Mil/uL (ref 3.87–5.11)
RDW: 13.2 % (ref 11.5–15.5)
WBC: 8.6 10*3/uL (ref 4.0–10.5)

## 2021-03-06 LAB — T4, FREE: Free T4: 0.68 ng/dL (ref 0.60–1.60)

## 2021-03-06 LAB — COMPREHENSIVE METABOLIC PANEL
ALT: 29 U/L (ref 0–35)
AST: 22 U/L (ref 0–37)
Albumin: 3.7 g/dL (ref 3.5–5.2)
Alkaline Phosphatase: 56 U/L (ref 39–117)
BUN: 11 mg/dL (ref 6–23)
CO2: 32 mEq/L (ref 19–32)
Calcium: 9.3 mg/dL (ref 8.4–10.5)
Chloride: 100 mEq/L (ref 96–112)
Creatinine, Ser: 0.75 mg/dL (ref 0.40–1.20)
GFR: 94.56 mL/min (ref >60.00)
Glucose, Bld: 133 mg/dL — ABNORMAL HIGH (ref 70–99)
Potassium: 3.8 mEq/L (ref 3.5–5.1)
Sodium: 138 mEq/L (ref 135–145)
Total Bilirubin: 0.3 mg/dL (ref 0.2–1.2)
Total Protein: 7.1 g/dL (ref 6.0–8.3)

## 2021-03-06 LAB — TSH: TSH: 0.66 u[IU]/mL (ref 0.35–4.50)

## 2021-03-06 NOTE — Progress Notes (Signed)
Name: SAMRA PESCH  MRN/ DOB: [id number], 1971-05-27    Age/ Sex: 50 y.o., female     PCP: Beola Cord, [place]   Reason for Endocrinology Evaluation: Hashimoto's Disease     Initial Endocrinology Clinic Visit: 12/05/2020    PATIENT IDENTIFIER: Ms. LATOSHA GAYLORD is a 50 y.o., female with a past medical history of Anemia, Asthma, migraine headaches and HTN. She has followed with [facility] since 12/05/2020 for consultative assistance with management of her Hashimoto's Disease  HISTORICAL SUMMARY:   She was found to have elevated Anti-TPO Ab at 147 IU/mL ( reference 0-34) and elevated Tg Ab's 642.9 IU/mL ( reference 0.0-0.9). Of note, the pt has normal total T3 at 143 ng/dL but low TSH at 0.26 uIU/mL  And normal FT4 at 1.09 ng/dL .  This was checked during routine check up   On her initial visit to our clinic she was noted to have a low TSH at 0.29 uIU/mL . Due to symptoms of fatigue, arthralgias, eye symptoms of discomfort and pruritic we opted to start methimazole.   TRAB is negative     She was started on Methimazole 12/2020     Mother had questionable thyromegaly , cousin with Grave's disease   Thyroid ultrasound showed questionable nodules   S/P hysterectomy    SUBJECTIVE:    Today (03/06/2021):  Ms. Berquist is here for Subclinical hyperthyroidism.  She continues with eye pruritus Hair loss and arthralgias have improved  Fatigue has improved  Denies abdominal pain and vomiting   C/O inability to lose weight despite using Saxenda  Reports hypoglycemia for a long time reports Bg;s on the 40's and 50's usually around mid day   Methimazole 5 mg , half a tablet daily    HISTORY:  Past Medical History:  Past Medical History:  Diagnosis Date  . ANEMIA-IRON DEFICIENCY 09/20/2007  . ASTHMA 09/20/2007  . COMMON MIGRAINE 01/02/2009  . FEVER UNSPECIFIED 01/17/2008  . FROZEN RIGHT SHOULDER 07/10/2009  . GERD 01/02/2009  . GLUCOSE INTOLERANCE 01/02/2009  .  Headache(784.0) 09/20/2007  . Heart palpitations    Hx: of with low potassium levels  . Hematuria 07/20/2011  . HYPERTENSION 01/02/2009  . Impaired glucose tolerance 07/17/2011  . LATERAL EPICONDYLITIS, RIGHT 06/05/2009  . Other and unspecified hyperlipidemia 01/15/2013  . PONV (postoperative nausea and vomiting)   . ROTATOR CUFF SYNDROME, RIGHT 05/27/2009  . SHOULDER PAIN, RIGHT 05/27/2009   Past Surgical History:  Past Surgical History:  Procedure Laterality Date  . ABDOMINAL HYSTERECTOMY    . BIOPSY  07/03/2020   Procedure: BIOPSY;  Surgeon: Rogene Houston, MD;  [facility];  Service: Endoscopy;;  . COLONOSCOPY WITH PROPOFOL N/A 07/03/2020   Procedure: COLONOSCOPY WITH PROPOFOL;  Surgeon: Rogene Houston, MD;  [facility];  Service: Endoscopy;  Laterality: N/A;  730  . ESOPHAGOGASTRODUODENOSCOPY (EGD) WITH PROPOFOL N/A 07/03/2020   Procedure: ESOPHAGOGASTRODUODENOSCOPY (EGD) WITH PROPOFOL;  Surgeon: Rogene Houston, MD;  [facility];  Service: Endoscopy;  Laterality: N/A;  . FRACTURE SURGERY     Hx: of left ankle  . HARDWARE REMOVAL Left 03/10/2013   Procedure: HARDWARE REMOVAL;  Surgeon: Newt Minion, MD;  Location: [place];  Service: Orthopedics;  Laterality: Left;  Left Ankle Removal Medial Malleolus Screws  . HYSTEROTOMY  2017  . POLYPECTOMY  07/03/2020   Procedure: POLYPECTOMY;  Surgeon: Rogene Houston, MD;  [facility];  Service: Endoscopy;;  . s/p  epicondylar release    . s/p LEEP procedure 2000 for abnormal pap     Normal since then  . s/p uterine ablation  2007  . TUBAL LIGATION  1955    Social History:  reports that she has never smoked. She has never used smokeless tobacco. She reports that she does not drink alcohol and does not use drugs. Family History:  Family History  Problem Relation Age of Onset  . Hypertension Father   . Hyperlipidemia Father   . Diabetes Father   . Colon cancer Paternal Grandfather   . Diabetes  Mother      HOME MEDICATIONS: Allergies as of 03/06/2021      Reactions   Omeprazole    Augmentin [amoxicillin-pot Clavulanate]    Cleocin [clindamycin Hcl] Diarrhea, Nausea And Vomiting   Frovatriptan Other (See Comments)   parasthesia   Gabapentin (once-daily)    Montelukast Sodium Other (See Comments)   parasthesia   Morphine And Related Nausea And Vomiting   And dizzy, and "feeling terrible"   Propranolol Hcl    unknown   Singulair [montelukast]    Sumatriptan Other (See Comments)   Masked asthma   Vancomycin Other (See Comments)   Had reaction during recovery, maybe hives or rash   Benzoin Rash   And blisters      Medication List       Accurate as of Mar 06, 2021  8:33 AM. If you have any questions, ask your nurse or doctor.        albuterol 108 (90 Base) MCG/ACT inhaler Commonly known as: VENTOLIN HFA Inhale 2 puffs into the lungs every 6 (six) hours as needed for wheezing.   APPLE CIDER VINEGAR DIET PO Take 1 capsule by mouth daily.   cetirizine 10 MG tablet Commonly known as: ZYRTEC Take 10 mg by mouth daily.   dexlansoprazole 60 MG capsule Commonly known as: Dexilant Take 1 capsule (60 mg total) by mouth daily.   diazepam 2 MG tablet Commonly known as: VALIUM Take 2 mg by mouth at bedtime as needed for anxiety or sleep.   dicyclomine 10 MG capsule Commonly known as: BENTYL Take 1 capsule (10 mg total) by mouth 2 (two) times daily as needed (diarrhea abd pain).   fexofenadine 180 MG tablet Commonly known as: ALLEGRA Take 1 tablet (180 mg total) by mouth every evening. What changed:   when to take this  reasons to take this   hydrochlorothiazide 25 MG tablet Commonly known as: HYDRODIURIL Take 25 mg by mouth daily.   hydrOXYzine 25 MG tablet Commonly known as: ATARAX/VISTARIL Take 25 mg by mouth 3 (three) times daily as needed for itching. As needed   losartan 100 MG tablet Commonly known as: COZAAR Take 100 mg by mouth daily.    magnesium gluconate 500 MG tablet Commonly known as: MAGONATE Take 500 mg by mouth every other day.   meclizine 12.5 MG tablet Commonly known as: ANTIVERT Take 12.5 mg by mouth 3 (three) times daily as needed (vertigo).   Melatonin 10 MG Tabs Take 10 mg by mouth every Monday, Tuesday, Wednesday, Thursday, and Friday.   meloxicam 15 MG tablet Commonly known as: MOBIC Take 1 tablet (15 mg total) by mouth daily as needed for pain (headache).   methimazole 5 MG tablet Commonly known as: TAPAZOLE Take 0.5 tablets (2.5 mg total) by mouth daily.   multivitamin with minerals Tabs tablet Take 1 tablet by mouth every evening.   nystatin-triamcinolone cream Commonly known as:  MYCOLOG II Apply 1 application topically at bedtime as needed.   promethazine 25 MG tablet Commonly known as: PHENERGAN Take 25 mg by mouth 2 (two) times daily as needed for nausea.   rizatriptan 10 MG tablet Commonly known as: MAXALT Take 1 tablet (10 mg total) by mouth daily as needed for migraine. 1 tablet by mouth for Migraine.  May repeat in 2 hours (no more than 2 in 24 hours or 2-3 days a week).   SAXENDA [place] Inject 1.8 mg into the skin daily.   vitamin C 1000 MG tablet Take 1,000 mg by mouth daily.   Vitamin D 125 MCG (5000 UT) Caps Take 5,000 Units by mouth daily.         OBJECTIVE:   PHYSICAL EXAM: VS: BP 124/82   Pulse 82   Ht 5' 7.5" (1.715 m)   Wt 273 lb 4 oz (123.9 kg)   LMP 02/06/2013   SpO2 98%   BMI 42.17 kg/m    EXAM: General: Pt appears well and is in NAD  Eyes: External eye exam normal without stare, lid lag or exophthalmos.  EOM intact.   Neck: General: Supple without adenopathy. Thyroid: Thyroid size normal.  No goiter or nodules appreciated.   Lungs: Clear with good BS bilat with no rales, rhonchi, or wheezes  Heart: Auscultation: RRR.  Abdomen: Normoactive bowel sounds, soft, nontender, without masses or organomegaly palpable  Extremities:  BL LE: No pretibial  edema normal ROM and strength.  Skin: Hair: Texture and amount normal with gender appropriate distribution Skin Inspection: No rashes Skin Palpation: Skin temperature, texture, and thickness normal to palpation  Mental Status: Judgment, insight: Intact Orientation: Oriented to time, place, and person Mood and affect: No depression, anxiety, or agitation     DATA REVIEWED:  Results for Eckard, LARENDA FEAST (MRN [id number]) as of 03/07/2021 12:43  Ref. Range 03/06/2021 09:08  Sodium Latest Ref Range: 135 - 145 mEq/L 138  Potassium Latest Ref Range: 3.5 - 5.1 mEq/L 3.8  Chloride Latest Ref Range: 96 - 112 mEq/L 100  CO2 Latest Ref Range: 19 - 32 mEq/L 32  Glucose Latest Ref Range: 70 - 99 mg/dL 133 (H)  BUN Latest Ref Range: 6 - 23 mg/dL 11  Creatinine Latest Ref Range: 0.40 - 1.20 mg/dL 0.75  Calcium Latest Ref Range: 8.4 - 10.5 mg/dL 9.3  Alkaline Phosphatase Latest Ref Range: 39 - 117 U/L 56  Albumin Latest Ref Range: 3.5 - 5.2 g/dL 3.7  AST Latest Ref Range: 0 - 37 U/L 22  ALT Latest Ref Range: 0 - 35 U/L 29  Total Protein Latest Ref Range: 6.0 - 8.3 g/dL 7.1  Total Bilirubin Latest Ref Range: 0.2 - 1.2 mg/dL 0.3  GFR Latest Ref Range: >60.00 mL/min 94.56  WBC Latest Ref Range: 4.0 - 10.5 K/uL 8.6  RBC Latest Ref Range: 3.87 - 5.11 Mil/uL 4.59  Hemoglobin Latest Ref Range: 12.0 - 15.0 g/dL 13.0  HCT Latest Ref Range: 36.0 - 46.0 % 38.3  MCV Latest Ref Range: 78.0 - 100.0 fl 83.4  MCHC Latest Ref Range: 30.0 - 36.0 g/dL 33.9  RDW Latest Ref Range: 11.5 - 15.5 % 13.2  Platelets Latest Ref Range: 150.0 - 400.0 K/uL 355.0  Neutrophils Latest Ref Range: 43.0 - 77.0 % 64.5  Lymphocytes Latest Ref Range: 12.0 - 46.0 % 27.0  Monocytes Relative Latest Ref Range: 3.0 - 12.0 % 4.4  Eosinophil Latest Ref Range: 0.0 - 5.0 % 3.5  Basophil Latest  Ref Range: 0.0 - 3.0 % 0.6  NEUT# Latest Ref Range: 1.4 - 7.7 K/uL 5.6  Lymphocyte # Latest Ref Range: 0.7 - 4.0 K/uL 2.3  Monocyte # Latest Ref Range: 0.1 -  1.0 K/uL 0.4  Eosinophils Absolute Latest Ref Range: 0.0 - 0.7 K/uL 0.3  Basophils Absolute Latest Ref Range: 0.0 - 0.1 K/uL 0.1  TSH Latest Ref Range: 0.35 - 4.50 uIU/mL 0.66  T4,Free(Direct) Latest Ref Range: 0.60 - 1.60 ng/dL 0.68     ASSESSMENT / PLAN / RECOMMENDATIONS:   1. Subclinical Hyperthyroidism:   - Discussed Graves' disease ( Hashi-toxi ) in this matter given low TSH in the setting of elevated Anti-TPO Ab  -She is clinically euthyroid -No local neck symptoms -Repeat TFTs today show normalization, with normal CBC and CMP    Medications   Continue methimazole 5 mg, half a tablet daily   Signed electronically by: Mack Guise, MD  [facility]  [facility] [address], [place] [address] Phone: [phone] FAX: [phone]      CC: Beola Cord, [place] [address] Phone: [phone]  Fax: [phone]   Return to Endocrinology clinic as below: Future Appointments  Date Time Provider [place]  08/05/2021 10:00 AM Rehman, Mechele Dawley, MD NRE-NRE None

## 2021-03-27 ENCOUNTER — Encounter: Payer: Self-pay | Admitting: Internal Medicine

## 2021-03-27 MED ORDER — METHIMAZOLE 5 MG PO TABS: 2.5000 mg | ORAL_TABLET | Freq: Every day | ORAL | 1 refills | Status: DC

## 2021-04-14 ENCOUNTER — Telehealth: Payer: Self-pay

## 2021-04-14 NOTE — Telephone Encounter (Signed)
NOTES ON FILE FROM Beola Cord FNP [id number], SENT REFERRAL TO SCHEDULING

## 2021-04-15 ENCOUNTER — Telehealth: Payer: Self-pay

## 2021-04-15 NOTE — Telephone Encounter (Signed)
NOTES ON FILE FROM Beola Cord FNP-C [phone]

## 2021-04-17 ENCOUNTER — Ambulatory Visit
Admission: RE | Admit: 2021-04-17 | Discharge: 2021-04-17 | Disposition: A | Discharge status: Home / Self Care | Payer: 59 | Source: Ambulatory Visit | Attending: Internal Medicine | Admitting: Internal Medicine

## 2021-04-17 DIAGNOSIS — E0590 Thyrotoxicosis, unspecified without thyrotoxic crisis or storm: Secondary | ICD-10-CM

## 2021-05-12 ENCOUNTER — Encounter (INDEPENDENT_AMBULATORY_CARE_PROVIDER_SITE_OTHER): Payer: Self-pay

## 2021-05-15 ENCOUNTER — Ambulatory Visit (INDEPENDENT_AMBULATORY_CARE_PROVIDER_SITE_OTHER): Payer: 59 | Admitting: Gastroenterology

## 2021-05-15 ENCOUNTER — Encounter (INDEPENDENT_AMBULATORY_CARE_PROVIDER_SITE_OTHER): Payer: Self-pay | Admitting: Gastroenterology

## 2021-05-15 ENCOUNTER — Other Ambulatory Visit: Payer: Self-pay

## 2021-05-15 ENCOUNTER — Encounter: Payer: Self-pay | Admitting: Internal Medicine

## 2021-05-15 DIAGNOSIS — L290 Pruritus ani: Secondary | ICD-10-CM

## 2021-05-15 DIAGNOSIS — K649 Unspecified hemorrhoids: Secondary | ICD-10-CM | POA: Diagnosis not present

## 2021-05-15 MED ORDER — ALBENDAZOLE 200 MG PO TABS: 400.0000 mg | ORAL_TABLET | Freq: Once | ORAL | 0 refills | Status: AC

## 2021-05-15 NOTE — Progress Notes (Signed)
Maylon Peppers, M.D. Gastroenterology & Hepatology [facility] [address] [place], [place] [address]  Primary Care Physician: Beola Cord, FNP [address] [place] [address]  I will communicate my assessment and recommendations to the referring MD via EMR.  Problems: Pruritus ani Internal and external hemorrhoids  History of Present Illness: Cassidy [name] is a 50 y.o. female with past medical history of asthma, GERD, IBS, hypertension, who presents for evaluation of anal itching.  The patient was last seen on 05/13/2020. At that time, the patient was advised to take [place] and to take Pepcid as needed.  She was scheduled for an EGD and a colonoscopy.  Findings are described below.  She was also given a prescription for dicyclomine for possible IBS.  Patient reprots she has had hemorrhoids since 1992 after she had one of her children.  She has had intermittent symptoms due to this but has been more concerned recently as she states that since she started working sitting for longer hours her symptoms are getting worse.  She reports that she has presented itching in the perianal area since 2017.  She has been concerned as the itching sensation has increased in frequency.  She has tried multiple medications such as topical clotrimazole for over a month without any relief.  She also considered it was possible that she had a parasitic infection as her symptoms were worse at night, for which she has tried 2 courses of over-the-counter medications for parasites without improvement.  As she thought this was related to hemorrhoids some point, she tried Anusol and Preparation H without improvement. She reports Preparation H actually worsens her symptoms.  Has tried some powder called "monkey butt powder" which has calamine but has not felt any improvement.  The patient states that very occasionally she has presented episodes of rectal  bleeding.  She reports that she used to have loose to watery bowel movements in the past.  However, she has noticed that after her thyroid function got better controlled, she has been presenting less frequent bowel movements, possibly 1 every other day.  She currently takes Metamucil x3 a day and eats a pear daily.  The patient denies having any nausea, vomiting, fever, chills, hematochezia, melena, hematemesis, abdominal distention, abdominal pain, diarrhea, jaundice, pruritus or weight loss.  Last EGD: 07/03/2020, normal esophagus, multiple 5 to 10 mm pancreatic polyps (5 polyps were removed with a hot snare, pathology showed histology consistent with fundic gland polyps), duodenum was normal (pathology was negative for celiac disease). Last Colonoscopy: 07/03/2020, small polyp was found in the cecum (pathology consistent with a tubular adenoma), diverticulosis, random biopsies were negative for microscopic colitis, presence of internal and external hemorrhoids.  Past Medical History: Past Medical History:  Diagnosis Date   ANEMIA-IRON DEFICIENCY 09/20/2007   ASTHMA 09/20/2007   COMMON MIGRAINE 01/02/2009   FEVER UNSPECIFIED 01/17/2008   FROZEN RIGHT SHOULDER 07/10/2009   GERD 01/02/2009   GLUCOSE INTOLERANCE 01/02/2009   Headache(784.0) 09/20/2007   Heart palpitations    Hx: of with low potassium levels   Hematuria 07/20/2011   HYPERTENSION 01/02/2009   Impaired glucose tolerance 07/17/2011   LATERAL EPICONDYLITIS, RIGHT 06/05/2009   Other and unspecified hyperlipidemia 01/15/2013   PONV (postoperative nausea and vomiting)    ROTATOR CUFF SYNDROME, RIGHT 05/27/2009   SHOULDER PAIN, RIGHT 05/27/2009    Past Surgical History: Past Surgical History:  Procedure Laterality Date   ABDOMINAL HYSTERECTOMY     BIOPSY  07/03/2020   Procedure: BIOPSY;  Surgeon: Rogene Houston, MD;  [facility];  Service: Endoscopy;;   COLONOSCOPY WITH PROPOFOL N/A 07/03/2020   Procedure: COLONOSCOPY WITH PROPOFOL;   Surgeon: Rogene Houston, MD;  [facility];  Service: Endoscopy;  Laterality: N/A;  730   ESOPHAGOGASTRODUODENOSCOPY (EGD) WITH PROPOFOL N/A 07/03/2020   Procedure: ESOPHAGOGASTRODUODENOSCOPY (EGD) WITH PROPOFOL;  Surgeon: Rogene Houston, MD;  [facility];  Service: Endoscopy;  Laterality: N/A;   FRACTURE SURGERY     Hx: of left ankle   HARDWARE REMOVAL Left 03/10/2013   Procedure: HARDWARE REMOVAL;  Surgeon: Newt Minion, MD;  Location: [place];  Service: Orthopedics;  Laterality: Left;  Left Ankle Removal Medial Malleolus Screws   HYSTEROTOMY  2017   POLYPECTOMY  07/03/2020   Procedure: POLYPECTOMY;  Surgeon: Rogene Houston, MD;  [facility];  Service: Endoscopy;;   s/p epicondylar release     s/p LEEP procedure 2000 for abnormal pap     Normal since then   s/p uterine ablation  2007   TUBAL LIGATION  1955    Family History: Family History  Problem Relation Age of Onset   Hypertension Father    Hyperlipidemia Father    Diabetes Father    Colon cancer Paternal Grandfather    Diabetes Mother     Social History: Social History   Tobacco Use  Smoking Status Never  Smokeless Tobacco Never   Social History   Substance and Sexual Activity  Alcohol Use No   Social History   Substance and Sexual Activity  Drug Use No    Allergies: Allergies  Allergen Reactions   Omeprazole    Augmentin [Amoxicillin-Pot Clavulanate]    Cleocin [Clindamycin Hcl] Diarrhea and Nausea And Vomiting   Frovatriptan Other (See Comments)    parasthesia   Gabapentin (Once-Daily)    Montelukast Sodium Other (See Comments)    parasthesia   Morphine And Related Nausea And Vomiting    And dizzy, and "feeling terrible"   Propranolol Hcl     unknown   Singulair [Montelukast]    Sumatriptan Other (See Comments)    Masked asthma   Vancomycin Other (See Comments)    Had reaction during recovery, maybe hives or rash   Benzoin Rash    And blisters     Medications: Current Outpatient Medications  Medication Sig Dispense Refill   albendazole (ALBENZA) 200 MG tablet Take 2 tablets (400 mg total) by mouth once for 1 dose. 4 tablet 0   albuterol (PROVENTIL HFA;VENTOLIN HFA) 108 (90 BASE) MCG/ACT inhaler Inhale 2 puffs into the lungs every 6 (six) hours as needed for wheezing. 3 Inhaler 3   Ascorbic Acid (VITAMIN C) 1000 MG tablet Take 1,000 mg by mouth daily.     cetirizine (ZYRTEC) 10 MG tablet Take 10 mg by mouth daily.      Cholecalciferol (VITAMIN D) 125 MCG (5000 UT) CAPS Take 5,000 Units by mouth daily.     dexlansoprazole (DEXILANT) 60 MG capsule Take 1 capsule (60 mg total) by mouth daily. 90 capsule 3   fexofenadine (ALLEGRA) 180 MG tablet Take 1 tablet (180 mg total) by mouth every evening. (Patient taking differently: Take 180 mg by mouth daily as needed for allergies.) 180 tablet 2   hydrochlorothiazide (HYDRODIURIL) 25 MG tablet Take 25 mg by mouth daily.     hydrOXYzine (ATARAX/VISTARIL) 25 MG tablet Take 25 mg by mouth 3 (three) times daily as  needed for itching. As needed     losartan (COZAAR) 100 MG tablet Take 100 mg by mouth daily.     magnesium gluconate (MAGONATE) 500 MG tablet Take 500 mg by mouth every other day.     Melatonin 10 MG TABS Take 10 mg by mouth every Monday, Tuesday, Wednesday, Thursday, and Friday.      methimazole (TAPAZOLE) 5 MG tablet Take 0.5 tablets (2.5 mg total) by mouth daily. 45 tablet 1   Misc Natural Products (APPLE CIDER VINEGAR DIET PO) Take 1 capsule by mouth daily.      Multiple Vitamin (MULTIVITAMIN WITH MINERALS) TABS Take 1 tablet by mouth every evening.     rizatriptan (MAXALT) 10 MG tablet Take 1 tablet (10 mg total) by mouth daily as needed for migraine. 1 tablet by mouth for Migraine.  May repeat in 2 hours (no more than 2 in 24 hours or 2-3 days a week). 10 tablet 5   dicyclomine (BENTYL) 10 MG capsule Take 1 capsule (10 mg total) by mouth 2 (two) times daily as needed (diarrhea abd  pain). (Patient not taking: Reported on 05/15/2021) 90 capsule 1   No current facility-administered medications for this visit.    Review of Systems: GENERAL: negative for malaise, night sweats HEENT: No changes in hearing or vision, no nose bleeds or other nasal problems. NECK: Negative for lumps, goiter, pain and significant neck swelling RESPIRATORY: Negative for cough, wheezing CARDIOVASCULAR: Negative for chest pain, leg swelling, palpitations, orthopnea GI: SEE HPI MUSCULOSKELETAL: Negative for joint pain or swelling, back pain, and muscle pain. SKIN: Negative for lesions, rash PSYCH: Negative for sleep disturbance, mood disorder and recent psychosocial stressors. HEMATOLOGY Negative for prolonged bleeding, bruising easily, and swollen nodes. ENDOCRINE: Negative for cold or heat intolerance, polyuria, polydipsia and goiter. NEURO: negative for tremor, gait imbalance, syncope and seizures. The remainder of the review of systems is noncontributory.   Physical Exam: BP 137/88 (BP Location: Right Arm, Patient Position: Sitting, Cuff Size: Large)   Pulse (!) 106   Temp 99.3 F (37.4 C) (Oral)   Ht 5' 7.5" (1.715 m)   Wt 273 lb (123.8 kg)   LMP 02/06/2013   BMI 42.13 kg/m  GENERAL: The patient is AO x3, in no acute distress. Obese. HEENT: Head is normocephalic and atraumatic. EOMI are intact. Mouth is well hydrated and without lesions. NECK: Supple. No masses LUNGS: Clear to auscultation. No presence of rhonchi/wheezing/rales. Adequate chest expansion HEART: RRR, normal s1 and s2. ABDOMEN: Soft, nontender, no guarding, no peritoneal signs, and nondistended. BS +. No masses. RECTAL EXAM: There was presence of few small anal tags but there was no presence of any erythema, rashes or any other lesions in the external area surrounding the anus.  Rectal tone was normal, there was presence of boggy mucosa likely due to internal hemorrhoids.  No stool in the rectal vault. Chaperone -  Scherrie Gerlach, NP. EXTREMITIES: Without any cyanosis, clubbing, rash, lesions or edema. NEUROLOGIC: AOx3, no focal motor deficit. SKIN: no jaundice, no rashes  Imaging/Labs: as above  I personally reviewed and interpreted the available labs, imaging and endoscopic files.  Impression and Plan: Cassidy [name] is a 50 y.o. female with past medical history of asthma, GERD, IBS, hypertension, who presents for evaluation of anal itching.  Patient has presented persistent anal pruritus without a clear cause.  Physical exam does not show any alterations that could explain her current symptoms, there are no dermatologic alterations at the moment.  I  explained to the patient that we will attempt to treat this symptom with the use of an empiric course of albendazole for possible oxyuriasis.  I also explained to her that another option would be a low-dose antidepressant but she is not interested in trying this medication as she has attempted that in the past and has developed side effects. Finally, I counseled the patient to start taking MiraLAX on top of her current regimen to soften her bowel movements and avoid any further episode of rectal bleeding from hemorrhoids due to constipation.  - Take albendazole empiric course - Start taking Miralax 1 capful every day for one week. If bowel movements do not improve, increase to 1 capful every 12 hours. If after two weeks there is no improvement, increase to 1 capful every 8 hours - Continue Metamucil  All questions were answered.      Harvel Quale, MD Gastroenterology and Hepatology [facility]

## 2021-05-15 NOTE — Patient Instructions (Signed)
Take albendazole  Start taking Miralax 1 capful every day for one week. If bowel movements do not improve, increase to 1 capful every 12 hours. If after two weeks there is no improvement, increase to 1 capful every 8 hours Continue Metamucil

## 2021-05-19 ENCOUNTER — Other Ambulatory Visit: Payer: Self-pay | Admitting: Internal Medicine

## 2021-05-19 DIAGNOSIS — R635 Abnormal weight gain: Secondary | ICD-10-CM

## 2021-06-02 NOTE — Progress Notes (Signed)
CARDIOLOGY CONSULT NOTE       Patient ID: Cassidy [name] MRN: [id number] DOB/AGE: 1971/01/29 50 y.o.  Admit date: (Not on file) Referring Physician: Carlene Coria Primary Physician: Beola Cord, [place] Primary Cardiologist: New Reason for Consultation: ? Claudication  Active Problems:   * No active hospital problems. *   HPI:  50 y.o. referred by Carlene Coria FNP for ? Claudication. She has a history of athma, GERD, IBS, HTN and Hashimoto's Disease Latter Rx with methimazole 12/2020 . Lab review shows A1c 6.6 LDL 129 Lpa 1490 Cr 0.7 K 3.9  TSH 0.84   Pain since breaking ankle in 2011 Had to be set twice still with hardware on lateral Side Sees Dr Sharol Given She describes cramping pain from knee down worse with exertion and better with rest. 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, and nurse tech at [employer]   Married 3 children one in [place]  Going to [place] with mother next month   ROS All other systems reviewed and negative except as noted above  Past Medical History:  Diagnosis Date   ANEMIA-IRON DEFICIENCY 09/20/2007   ASTHMA 09/20/2007   COMMON MIGRAINE 01/02/2009   FEVER UNSPECIFIED 01/17/2008   FROZEN RIGHT SHOULDER 07/10/2009   GERD 01/02/2009   GLUCOSE INTOLERANCE 01/02/2009   Headache(784.0) 09/20/2007   Heart palpitations    Hx: of with low potassium levels   Hematuria 07/20/2011   HYPERTENSION 01/02/2009   Impaired glucose tolerance 07/17/2011   LATERAL EPICONDYLITIS, RIGHT 06/05/2009   Other and unspecified hyperlipidemia 01/15/2013   PONV (postoperative nausea and vomiting)    ROTATOR CUFF SYNDROME, RIGHT 05/27/2009   SHOULDER PAIN, RIGHT 05/27/2009    Family History  Problem Relation Age of Onset   Hypertension Father    Hyperlipidemia Father    Diabetes Father    Colon cancer Paternal Grandfather    Diabetes Mother     Social History   Socioeconomic History   Marital status: Single    Spouse name: Not on file   Number of children: Not  on file   Years of education: Not on file   Highest education level: Not on file  Occupational History   Not on file  Tobacco Use   Smoking status: Never   Smokeless tobacco: Never  Vaping Use   Vaping Use: Never used  Substance and Sexual Activity   Alcohol use: No   Drug use: No   Sexual activity: Yes  Other Topics Concern   Not on file  Social History Narrative   Not on file   Social Determinants of Health   Financial Resource Strain: Not on file  Food Insecurity: Not on file  Transportation Needs: Not on file  Physical Activity: Not on file  Stress: Not on file  Social Connections: Not on file  Intimate Partner Violence: Not on file    Past Surgical History:  Procedure Laterality Date   ABDOMINAL HYSTERECTOMY     BIOPSY  07/03/2020   Procedure: BIOPSY;  Surgeon: Rogene Houston, MD;  [facility];  Service: Endoscopy;;   COLONOSCOPY WITH PROPOFOL N/A 07/03/2020   Procedure: COLONOSCOPY WITH PROPOFOL;  Surgeon: Rogene Houston, MD;  [facility];  Service: Endoscopy;  Laterality: N/A;  730   ESOPHAGOGASTRODUODENOSCOPY (EGD) WITH PROPOFOL N/A 07/03/2020   Procedure: ESOPHAGOGASTRODUODENOSCOPY (EGD) WITH PROPOFOL;  Surgeon: Rogene Houston, MD;  [facility];  Service: Endoscopy;  Laterality: N/A;  FRACTURE SURGERY     Hx: of left ankle   HARDWARE REMOVAL Left 03/10/2013   Procedure: HARDWARE REMOVAL;  Surgeon: Newt Minion, MD;  Location: [facility];  Service: Orthopedics;  Laterality: Left;  Left Ankle Removal Medial Malleolus Screws   HYSTEROTOMY  2017   POLYPECTOMY  07/03/2020   Procedure: POLYPECTOMY;  Surgeon: Rogene Houston, MD;  [facility];  Service: Endoscopy;;   s/p epicondylar release     s/p LEEP procedure 2000 for abnormal pap     Normal since then   s/p uterine ablation  2007   TUBAL LIGATION  1955      Current Outpatient Medications:    albuterol (PROVENTIL HFA;VENTOLIN HFA) 108 (90 BASE) MCG/ACT inhaler,  Inhale 2 puffs into the lungs every 6 (six) hours as needed for wheezing., Disp: 3 Inhaler, Rfl: 3   Ascorbic Acid (VITAMIN C) 1000 MG tablet, Take 1,000 mg by mouth daily., Disp: , Rfl:    cetirizine (ZYRTEC) 10 MG tablet, Take 10 mg by mouth daily. , Disp: , Rfl:    Cholecalciferol (VITAMIN D) 125 MCG (5000 UT) CAPS, Take 5,000 Units by mouth daily., Disp: , Rfl:    dexlansoprazole (DEXILANT) 60 MG capsule, Take 1 capsule (60 mg total) by mouth daily., Disp: 90 capsule, Rfl: 3   fexofenadine (ALLEGRA) 180 MG tablet, Take 1 tablet (180 mg total) by mouth every evening. (Patient taking differently: Take 180 mg by mouth daily as needed for allergies.), Disp: 180 tablet, Rfl: 2   hydrochlorothiazide (HYDRODIURIL) 25 MG tablet, Take 25 mg by mouth daily., Disp: , Rfl:    hydrOXYzine (ATARAX/VISTARIL) 25 MG tablet, Take 25 mg by mouth 3 (three) times daily as needed for itching. As needed, Disp: , Rfl:    losartan (COZAAR) 100 MG tablet, Take 100 mg by mouth daily., Disp: , Rfl:    magnesium gluconate (MAGONATE) 500 MG tablet, Take 500 mg by mouth every other day., Disp: , Rfl:    Melatonin 10 MG TABS, Take 10 mg by mouth every Monday, Tuesday, Wednesday, Thursday, and Friday. , Disp: , Rfl:    methimazole (TAPAZOLE) 5 MG tablet, Take 0.5 tablets (2.5 mg total) by mouth daily., Disp: 45 tablet, Rfl: 1   Misc Natural Products (APPLE CIDER VINEGAR DIET PO), Take 1 capsule by mouth daily. , Disp: , Rfl:    Multiple Vitamin (MULTIVITAMIN WITH MINERALS) TABS, Take 1 tablet by mouth every evening., Disp: , Rfl:    rizatriptan (MAXALT) 10 MG tablet, Take 1 tablet (10 mg total) by mouth daily as needed for migraine. 1 tablet by mouth for Migraine.  May repeat in 2 hours (no more than 2 in 24 hours or 2-3 days a week)., Disp: 10 tablet, Rfl: 5   Semaglutide,0.25 or 0.'5MG'$ /DOS, (OZEMPIC, 0.25 OR 0.5 MG/DOSE,) 2 MG/1.5ML SOPN, Inject 0.5 mg into the skin once a week., Disp: 4.5 mL, Rfl: 1   dicyclomine (BENTYL) 10  MG capsule, Take 1 capsule (10 mg total) by mouth 2 (two) times daily as needed (diarrhea abd pain). (Patient not taking: No sig reported), Disp: 90 capsule, Rfl: 1    Physical Exam: Blood pressure 120/80, pulse 70, height '5\' 7"'$  (1.702 m), weight 124.3 kg, last menstrual period 02/06/2013, SpO2 99 %.    Affect appropriate Healthy:  appears stated age 46: normal Neck supple with no adenopathy JVP normal no bruits no thyromegaly Lungs clear with no wheezing and good diaphragmatic motion Heart:  S1/S2 no murmur, no  rub, gallop or click PMI normal Abdomen: benighn, BS positve, no tenderness, no AAA no bruit.  No HSM or HJR Distal pulses intact  No edema Neuro non-focal Skin warm and dry Previous surgery left ankle with scars on both medial and lateral aspect    Labs:   Lab Results  Component Value Date   WBC 8.6 03/06/2021   HGB 13.0 03/06/2021   HCT 38.3 03/06/2021   MCV 83.4 03/06/2021   PLT 355.0 03/06/2021   No results for input(s): NA, K, CL, CO2, BUN, CREATININE, CALCIUM, PROT, BILITOT, ALKPHOS, ALT, AST, GLUCOSE in the last 168 hours.  Invalid input(s): LABALBU No results found for: CKTOTAL, CKMB, CKMBINDEX, TROPONINI  Lab Results  Component Value Date   CHOL 210 (H) 01/13/2013   CHOL 175 07/16/2011   CHOL 185 07/03/2009   Lab Results  Component Value Date   HDL 37.90 (L) 01/13/2013   HDL 44.20 07/16/2011   HDL 43.30 07/03/2009   Lab Results  Component Value Date   LDLCALC 99 07/16/2011   LDLCALC 116 (H) 07/03/2009   Lab Results  Component Value Date   TRIG 162.0 (H) 01/13/2013   TRIG 159.0 (H) 07/16/2011   TRIG 130.0 07/03/2009   Lab Results  Component Value Date   CHOLHDL 6 01/13/2013   CHOLHDL 4 07/16/2011   CHOLHDL 4 07/03/2009   Lab Results  Component Value Date   LDLDIRECT 126.4 01/13/2013   LDLDIRECT 135.5 01/02/2009      Radiology: No results found.  EKG: NSR normal Poor R wave progression from body habitus    ASSESSMENT AND  PLAN:   Claudication:  Sounds more like arthritic pain with ? Restless leg Pulses palpable will order ABI's and LE arterial duplex  HTN:  Well controlled.  Continue current medications and low sodium Dash type diet.   Thyroid:  on  ta[azole f/u endocrine TSH normal 0.66 03/06/21  GI:  continue Dexilant history of polyps and GERD  Asthma:  no active wheezing PRN proventil and zyrtec  CAD:  risk discussed utility of coronary calcium score   ABI"s  LE arterial duplex  Calcium Score   F/U PRN if normal   Signed: Jenkins Rouge 06/09/2021, 9:49 AM

## 2021-06-06 ENCOUNTER — Encounter: Payer: Self-pay | Admitting: Internal Medicine

## 2021-06-06 ENCOUNTER — Ambulatory Visit (INDEPENDENT_AMBULATORY_CARE_PROVIDER_SITE_OTHER): Payer: 59 | Admitting: Internal Medicine

## 2021-06-06 ENCOUNTER — Other Ambulatory Visit: Payer: Self-pay

## 2021-06-06 VITALS — BP 118/78 | HR 76 | Wt 271.4 lb

## 2021-06-06 DIAGNOSIS — R635 Abnormal weight gain: Secondary | ICD-10-CM | POA: Diagnosis not present

## 2021-06-06 DIAGNOSIS — E0590 Thyrotoxicosis, unspecified without thyrotoxic crisis or storm: Secondary | ICD-10-CM | POA: Diagnosis not present

## 2021-06-06 DIAGNOSIS — E119 Type 2 diabetes mellitus without complications: Secondary | ICD-10-CM | POA: Diagnosis not present

## 2021-06-06 LAB — T4, FREE: Free T4: 0.65 ng/dL (ref 0.60–1.60)

## 2021-06-06 LAB — TSH: TSH: 1.07 u[IU]/mL (ref 0.35–5.50)

## 2021-06-06 MED ORDER — OZEMPIC (0.25 OR 0.5 MG/DOSE) 2 MG/1.5ML ~~LOC~~ SOPN: 0.5000 mg | PEN_INJECTOR | SUBCUTANEOUS | 1 refills | Status: DC

## 2021-06-06 NOTE — Patient Instructions (Addendum)
-   STOP Metformin  - Start Ozempic 0.25 mg weekly for 6 weeks, then increase to 0.5 mg weekly after that  - Continue Methimazole 5 mg, Half a tablet daily

## 2021-06-06 NOTE — Progress Notes (Signed)
Name: Cassidy [name]  MRN/ DOB: [id number], 08/25/1971    Age/ Sex: 50 y.o., female     PCP: Beola Cord, [place]   Reason for Endocrinology Evaluation: Hashimoto's Disease     Initial Endocrinology Clinic Visit: 12/05/2020    PATIENT IDENTIFIER: Cassidy [name] is a 50 y.o., female with a past medical history of Anemia, Asthma, migraine headaches and HTN. She has followed with [facility] since 12/05/2020 for consultative assistance with management of her Hashimoto's Disease  HISTORICAL SUMMARY:   She was found to have elevated Anti-TPO Ab at 147 IU/mL ( reference 0-34) and elevated Tg Ab's 642.9 IU/mL ( reference 0.0-0.9). Of note, the pt has normal total T3 at 143 ng/dL but low TSH at 0.26 uIU/mL  And normal FT4 at 1.09 ng/dL .   This was checked during routine check up   On her initial visit to our clinic she was noted to have a low TSH at 0.29 uIU/mL . Due to symptoms of fatigue, arthralgias, eye symptoms of discomfort and pruritic we opted to start methimazole.   TRAB is negative     She was started on Methimazole 12/2020     Mother had questionable thyromegaly , cousin with Grave's disease    Thyroid ultrasound showed questionable nodules    S/P hysterectomy    DIABETES HISTORY :  A1c in 04/2021 was 6.6 % , was started on Metformin through PCP but was unable to tolerate it.    SUBJECTIVE:    Today (06/06/2021):  Cassidy [name] is here for Subclinical hyperthyroidism.  Has been having loose stools with diarrhea  Denies abdominal pain  Denies palpitations Denies tremors  Pending thyroid ultrasound    Methimazole 5 mg , half a tablet daily    HISTORY:  Past Medical History:  Past Medical History:  Diagnosis Date   ANEMIA-IRON DEFICIENCY 09/20/2007   ASTHMA 09/20/2007   COMMON MIGRAINE 01/02/2009   FEVER UNSPECIFIED 01/17/2008   FROZEN RIGHT SHOULDER 07/10/2009   GERD 01/02/2009   GLUCOSE INTOLERANCE 01/02/2009   Headache(784.0) 09/20/2007   Heart  palpitations    Hx: of with low potassium levels   Hematuria 07/20/2011   HYPERTENSION 01/02/2009   Impaired glucose tolerance 07/17/2011   LATERAL EPICONDYLITIS, RIGHT 06/05/2009   Other and unspecified hyperlipidemia 01/15/2013   PONV (postoperative nausea and vomiting)    ROTATOR CUFF SYNDROME, RIGHT 05/27/2009   SHOULDER PAIN, RIGHT 05/27/2009   Past Surgical History:  Past Surgical History:  Procedure Laterality Date   ABDOMINAL HYSTERECTOMY     BIOPSY  07/03/2020   Procedure: BIOPSY;  Surgeon: Rogene Houston, MD;  [facility];  Service: Endoscopy;;   COLONOSCOPY WITH PROPOFOL N/A 07/03/2020   Procedure: COLONOSCOPY WITH PROPOFOL;  Surgeon: Rogene Houston, MD;  [facility];  Service: Endoscopy;  Laterality: N/A;  730   ESOPHAGOGASTRODUODENOSCOPY (EGD) WITH PROPOFOL N/A 07/03/2020   Procedure: ESOPHAGOGASTRODUODENOSCOPY (EGD) WITH PROPOFOL;  Surgeon: Rogene Houston, MD;  [facility];  Service: Endoscopy;  Laterality: N/A;   FRACTURE SURGERY     Hx: of left ankle   HARDWARE REMOVAL Left 03/10/2013   Procedure: HARDWARE REMOVAL;  Surgeon: Newt Minion, MD;  Location: [facility];  Service: Orthopedics;  Laterality: Left;  Left Ankle Removal Medial Malleolus Screws   HYSTEROTOMY  2017   POLYPECTOMY  07/03/2020   Procedure: POLYPECTOMY;  Surgeon: Rogene Houston, MD;  [facility];  Service: Endoscopy;;  s/p epicondylar release     s/p LEEP procedure 2000 for abnormal pap     Normal since then   s/p uterine ablation  2007   [place]   Social History:  reports that she has never smoked. She has never used smokeless tobacco. She reports that she does not drink alcohol and does not use drugs. Family History:  Family History  Problem Relation Age of Onset   Hypertension Father    Hyperlipidemia Father    Diabetes Father    Colon cancer Paternal Grandfather    Diabetes Mother      HOME MEDICATIONS: Allergies as of 06/06/2021        Reactions   Omeprazole    Augmentin [amoxicillin-pot Clavulanate]    Cleocin [clindamycin Hcl] Diarrhea, Nausea And Vomiting   Frovatriptan Other (See Comments)   parasthesia   Gabapentin (once-daily)    Montelukast Sodium Other (See Comments)   parasthesia   Morphine And Related Nausea And Vomiting   And dizzy, and "feeling terrible"   Propranolol Hcl    unknown   Singulair [montelukast]    Sumatriptan Other (See Comments)   Masked asthma   Vancomycin Other (See Comments)   Had reaction during recovery, maybe hives or rash   Benzoin Rash   And blisters        Medication List        Accurate as of June 06, 2021  3:04 PM. If you have any questions, ask your nurse or doctor.          STOP taking these medications    metFORMIN 500 MG 24 hr tablet Commonly known as: GLUCOPHAGE-XR Stopped by: Dorita Sciara, MD       TAKE these medications    albuterol 108 (90 Base) MCG/ACT inhaler Commonly known as: VENTOLIN HFA Inhale 2 puffs into the lungs every 6 (six) hours as needed for wheezing.   APPLE CIDER VINEGAR DIET PO Take 1 capsule by mouth daily.   cetirizine 10 MG tablet Commonly known as: ZYRTEC Take 10 mg by mouth daily.   dexlansoprazole 60 MG capsule Commonly known as: Dexilant Take 1 capsule (60 mg total) by mouth daily.   dicyclomine 10 MG capsule Commonly known as: BENTYL Take 1 capsule (10 mg total) by mouth 2 (two) times daily as needed (diarrhea abd pain).   fexofenadine 180 MG tablet Commonly known as: ALLEGRA Take 1 tablet (180 mg total) by mouth every evening. What changed:  when to take this reasons to take this   hydrochlorothiazide 25 MG tablet Commonly known as: HYDRODIURIL Take 25 mg by mouth daily.   hydrOXYzine 25 MG tablet Commonly known as: ATARAX/VISTARIL Take 25 mg by mouth 3 (three) times daily as needed for itching. As needed   losartan 100 MG tablet Commonly known as: COZAAR Take 100 mg by mouth daily.    magnesium gluconate 500 MG tablet Commonly known as: MAGONATE Take 500 mg by mouth every other day.   Melatonin 10 MG Tabs Take 10 mg by mouth every Monday, Tuesday, Wednesday, Thursday, and Friday.   methimazole 5 MG tablet Commonly known as: TAPAZOLE Take 0.5 tablets (2.5 mg total) by mouth daily.   multivitamin with minerals Tabs tablet Take 1 tablet by mouth every evening.   Ozempic (0.25 or 0.5 MG/DOSE) 2 MG/1.5ML Sopn Generic drug: Semaglutide(0.25 or 0.'5MG'$ /DOS) Inject 0.5 mg into the skin once a week. Started by: Dorita Sciara, MD   rizatriptan 10 MG tablet Commonly  known as: MAXALT Take 1 tablet (10 mg total) by mouth daily as needed for migraine. 1 tablet by mouth for Migraine.  May repeat in 2 hours (no more than 2 in 24 hours or 2-3 days a week).   vitamin C 1000 MG tablet Take 1,000 mg by mouth daily.   Vitamin D 125 MCG (5000 UT) Caps Take 5,000 Units by mouth daily.          OBJECTIVE:   PHYSICAL EXAM: VS: BP 118/78   Pulse 76   Wt 271 lb 6.4 oz (123.1 kg)   LMP 02/06/2013   SpO2 99%   BMI 41.88 kg/m    EXAM: General: Pt appears well and is in NAD  Neck: General: Supple without adenopathy. Thyroid: Thyroid size normal.  No goiter or nodules appreciated.   Lungs: Clear with good BS bilat with no rales, rhonchi, or wheezes  Heart: Auscultation: RRR.  Abdomen: Normoactive bowel sounds, soft, nontender, without masses or organomegaly palpable  Extremities:  BL LE: No pretibial edema normal ROM and strength.  Mental Status: Judgment, insight: Intact Orientation: Oriented to time, place, and person Mood and affect: No depression, anxiety, or agitation     DATA REVIEWED: Results for Mignano, Cassidy [name] (MRN [id number]) as of 06/06/2021 15:03  Ref. Range 06/06/2021 08:28  TSH Latest Ref Range: 0.35 - 5.50 uIU/mL 1.07  T4,Free(Direct) Latest Ref Range: 0.60 - 1.60 ng/dL 0.65    04/09/2021 A1c 6.6%   ASSESSMENT / PLAN / RECOMMENDATIONS:    Subclinical Hyperthyroidism:   - Discussed Graves' disease ( Hashi-toxi ) in this matter given low TSH in the setting of elevated Anti-TPO Ab  -She is clinically euthyroid -No local neck symptoms -Repeat TFTs today are normal, will continue    Medications   Continue methimazole 5 mg, half a tablet daily   2. T2DM, Optimally Controlled:   -She was started on metformin by her PCP but she is having GI intolerance issues, will switch to Ozempic that way to optimize her weight loss  Medication - Stop Metformin due to diarrhea  - Will start Ozempic 0.25 mg weekly for 6 weeks then increase to 0.5 mg weekly   Follow-up in 4 months  Signed electronically by: Mack Guise, MD  [facility] Endocrinology  [facility] [address] [place], [place] [address] Phone: [phone] FAX: [phone]      CC: Beola Cord, [place] [address] Phone: [phone]  Fax: [phone]   Return to Endocrinology clinic as below: Future Appointments  Date Time Provider [place]  06/09/2021  9:30 AM Josue Hector, MD [facility] [place] [facility]  08/05/2021 10:00 AM Rogene Houston, MD [facility] None  10/17/2021  7:30 AM Mac Dowdell, Melanie Crazier, MD [facility] None  10/17/2021  8:30 AM [facility] [place] [facility]

## 2021-06-07 LAB — CREATININE, URINE, 24 HOUR
Creatinine, 24H Ur: 1623 mg/24 hr (ref 800–1800)
Creatinine, Urine: 108.2 mg/dL

## 2021-06-09 ENCOUNTER — Telehealth: Payer: Self-pay | Admitting: Pharmacy Technician

## 2021-06-09 ENCOUNTER — Other Ambulatory Visit: Payer: Self-pay

## 2021-06-09 ENCOUNTER — Ambulatory Visit (INDEPENDENT_AMBULATORY_CARE_PROVIDER_SITE_OTHER): Payer: 59 | Admitting: Cardiovascular Disease

## 2021-06-09 ENCOUNTER — Encounter: Payer: Self-pay | Admitting: Cardiovascular Disease

## 2021-06-09 VITALS — BP 120/80 | HR 70 | Ht 67.0 in | Wt 274.0 lb

## 2021-06-09 DIAGNOSIS — J4520 Mild intermittent asthma, uncomplicated: Secondary | ICD-10-CM

## 2021-06-09 DIAGNOSIS — I10 Essential (primary) hypertension: Secondary | ICD-10-CM | POA: Diagnosis not present

## 2021-06-09 DIAGNOSIS — K219 Gastro-esophageal reflux disease without esophagitis: Secondary | ICD-10-CM | POA: Diagnosis not present

## 2021-06-09 DIAGNOSIS — I739 Peripheral vascular disease, unspecified: Secondary | ICD-10-CM | POA: Diagnosis not present

## 2021-06-09 NOTE — Addendum Note (Signed)
Addended by: Levonne Hubert on: 06/09/2021 12:08 PM   Modules accepted: Orders

## 2021-06-09 NOTE — Telephone Encounter (Addendum)
Patient Advocate Encounter   Received notification from [place] that prior authorization for [facility] is required.   PA submitted on 06/09/2021 [place] Status is APPROVED 06/10/2021 - 06/10/2022    [facility] will continue to follow   Cassidy [name], CPhT Patient Advocate [facility] Phone: [phone] Fax:  [phone]

## 2021-06-09 NOTE — Patient Instructions (Signed)
Medication Instructions:  Your physician recommends that you continue on your current medications as directed. Please refer to the Current Medication list given to you today.  *If you need a refill on your cardiac medications before your next appointment, please call your pharmacy*   Lab Work: NONE  If you have labs (blood work) drawn today and your tests are completely normal, you will receive your results only by: [place] (if you have MyChart) OR A paper copy in the mail If you have any lab test that is abnormal or we need to change your treatment, we will call you to review the results.   Testing/Procedures: Your physician has requested that you have an ankle brachial index (ABI). During this test an ultrasound and blood pressure cuff are used to evaluate the arteries that supply the arms and legs with blood. Allow thirty minutes for this exam. There are no restrictions or special instructions.  Calcium Scoring Test    Follow-Up: At [facility] you and your health needs are our priority.  As part of our continuing mission to provide you with exceptional heart care, we have created designated Provider Care Teams.  These Care Teams include your primary Cardiologist (physician) and Advanced Practice Providers (APPs -  Physician Assistants and Nurse Practitioners) who all work together to provide you with the care you need, when you need it.  We recommend signing up for the patient portal called "MyChart".  Sign up information is provided on this After Visit Summary.  MyChart is used to connect with patients for Virtual Visits (Telemedicine).  Patients are able to view lab/test results, encounter notes, upcoming appointments, etc.  Non-urgent messages can be sent to your provider as well.   To learn more about what you can do with MyChart, go to [email or web address].    Your next appointment:    As Needed   The format for your next appointment:   In Person  Provider:    Jenkins Rouge, MD   Other Instructions Thank you for choosing [facility]!

## 2021-06-09 NOTE — Addendum Note (Signed)
Addended by: Levonne Hubert on: 06/09/2021 10:33 AM   Modules accepted: Orders

## 2021-06-13 LAB — CORTISOL, URINE, FREE
Cortisol (Ur), Free: 12 ug/24 hr (ref 6–42)
Cortisol,F,ug/L,U: 8 ug/L

## 2021-08-05 ENCOUNTER — Ambulatory Visit (INDEPENDENT_AMBULATORY_CARE_PROVIDER_SITE_OTHER): Payer: 59 | Admitting: Internal Medicine

## 2021-08-25 ENCOUNTER — Ambulatory Visit (INDEPENDENT_AMBULATORY_CARE_PROVIDER_SITE_OTHER): Payer: 59 | Admitting: Gastroenterology

## 2021-09-16 ENCOUNTER — Ambulatory Visit (HOSPITAL_COMMUNITY): Payer: 59

## 2021-10-13 ENCOUNTER — Other Ambulatory Visit: Payer: Self-pay | Admitting: Internal Medicine

## 2021-10-17 ENCOUNTER — Ambulatory Visit (INDEPENDENT_AMBULATORY_CARE_PROVIDER_SITE_OTHER): Payer: 59 | Admitting: Internal Medicine

## 2021-10-17 ENCOUNTER — Encounter: Payer: Self-pay | Admitting: Internal Medicine

## 2021-10-17 ENCOUNTER — Ambulatory Visit (HOSPITAL_COMMUNITY)
Admission: RE | Admit: 2021-10-17 | Discharge: 2021-10-17 | Disposition: A | Discharge status: Home / Self Care | Payer: 59 | Source: Ambulatory Visit | Attending: Cardiovascular Disease | Admitting: Cardiovascular Disease

## 2021-10-17 ENCOUNTER — Ambulatory Visit
Admission: RE | Admit: 2021-10-17 | Discharge: 2021-10-17 | Disposition: A | Discharge status: Home / Self Care | Payer: 59 | Source: Ambulatory Visit | Attending: Internal Medicine | Admitting: Internal Medicine

## 2021-10-17 ENCOUNTER — Other Ambulatory Visit: Payer: Self-pay

## 2021-10-17 VITALS — BP 124/80 | HR 84 | Ht 67.0 in | Wt 272.0 lb

## 2021-10-17 DIAGNOSIS — E119 Type 2 diabetes mellitus without complications: Secondary | ICD-10-CM

## 2021-10-17 DIAGNOSIS — E042 Nontoxic multinodular goiter: Secondary | ICD-10-CM | POA: Diagnosis not present

## 2021-10-17 DIAGNOSIS — I739 Peripheral vascular disease, unspecified: Secondary | ICD-10-CM | POA: Insufficient documentation

## 2021-10-17 DIAGNOSIS — E0590 Thyrotoxicosis, unspecified without thyrotoxic crisis or storm: Secondary | ICD-10-CM

## 2021-10-17 DIAGNOSIS — E785 Hyperlipidemia, unspecified: Secondary | ICD-10-CM | POA: Diagnosis not present

## 2021-10-17 LAB — TSH: TSH: 1.04 u[IU]/mL (ref 0.35–5.50)

## 2021-10-17 LAB — POCT GLYCOSYLATED HEMOGLOBIN (HGB A1C): Hemoglobin A1C: 5.7 % — AB (ref 4.0–5.6)

## 2021-10-17 LAB — GLUCOSE, POCT (MANUAL RESULT ENTRY): POC Glucose: 113 mg/dl — AB (ref 70–99)

## 2021-10-17 LAB — T4, FREE: Free T4: 0.79 ng/dL (ref 0.60–1.60)

## 2021-10-17 MED ORDER — ATORVASTATIN CALCIUM 10 MG PO TABS: 10.0000 mg | ORAL_TABLET | Freq: Every day | ORAL | 3 refills | Status: DC

## 2021-10-17 MED ORDER — OZEMPIC (0.25 OR 0.5 MG/DOSE) 2 MG/1.5ML ~~LOC~~ SOPN: 0.5000 mg | PEN_INJECTOR | SUBCUTANEOUS | 3 refills | Status: DC

## 2021-10-17 NOTE — Patient Instructions (Addendum)
-   Continue Ozempic 0.5 mg weekly  - Start Lipitor 10 mg daily

## 2021-10-17 NOTE — Progress Notes (Signed)
Name: Cassidy [name]  Age/ Sex: 50 y.o., female   MRN/ DOB: [id number], 1971/06/26     PCP: Beola Cord, FNP   Reason for Endocrinology Evaluation: Hyperthyroidism/Type 2 Diabetes Mellitus  Initial Endocrine Consultative Visit: 12/05/2020    PATIENT IDENTIFIER: [name] is a 50 y.o. female with a past medical history of hyperthyroidism and T2DM. The patient has followed with Endocrinology clinic since 12/05/2020 for consultative assistance with management of her diabetes.  DIABETIC HISTORY:  Cassidy [name] was diagnosed with DM 04/2021, with an A1c 6.6%. She was started on Metfrmin through her PCP but was unable to tolerate.  In 06/2021 we switched  Metformin to Ozempic due to GI issues with Metformin   She had normal 24-hr urinary cortisol for weight gain 06/2021    THYROID HISTORY : She was found to have elevated Anti-TPO Ab at 147 IU/mL ( reference 0-34) and elevated Tg Ab's 642.9 IU/mL ( reference 0.0-0.9). Of note, the pt has normal total T3 at 143 ng/dL but low TSH at 0.26 uIU/mL  And normal FT4 at 1.09 ng/dL .   This was checked during routine check up    On her initial visit to our clinic she was noted to have a low TSH at 0.29 uIU/mL . Due to symptoms of fatigue, arthralgias, eye symptoms of discomfort and pruritic we opted to start methimazole.    TRAB is negative        She was started on Methimazole 12/2020        Mother had questionable thyromegaly , cousin with Grave's disease    Thyroid ultrasound showed questionable nodules    S/P hysterectomy      SUBJECTIVE:   During the last visit (06/06/2021): Switched Metformin to [place]   Today (10/17/2021): Cassidy [name] is here for a follow up on Diabetes and hyperthyroidism.  She checks her blood sugars occasionally.    Denies nausea, vomiting or diarrhea    HOME ENDOCRINE REGIMEN:  Ozempic 0.5 mg weekly  Methimazole 5 mg , half a tablet daily     Statin: no ACE-I/ARB: yes    METER DOWNLOAD SUMMARY: Did  not bring    DIABETIC COMPLICATIONS: Microvascular complications:   Denies: neuropathy , retinopathy  Last Eye Exam: Completed 2021  Macrovascular complications:   Denies: CAD, CVA, PVD   HISTORY:  Past Medical History:  Past Medical History:  Diagnosis Date   ANEMIA-IRON DEFICIENCY 09/20/2007   ASTHMA 09/20/2007   COMMON MIGRAINE 01/02/2009   FEVER UNSPECIFIED 01/17/2008   FROZEN RIGHT SHOULDER 07/10/2009   GERD 01/02/2009   GLUCOSE INTOLERANCE 01/02/2009   Headache(784.0) 09/20/2007   Heart palpitations    Hx: of with low potassium levels   Hematuria 07/20/2011   HYPERTENSION 01/02/2009   Impaired glucose tolerance 07/17/2011   LATERAL EPICONDYLITIS, RIGHT 06/05/2009   Other and unspecified hyperlipidemia 01/15/2013   PONV (postoperative nausea and vomiting)    ROTATOR CUFF SYNDROME, RIGHT 05/27/2009   SHOULDER PAIN, RIGHT 05/27/2009   Past Surgical History:  Past Surgical History:  Procedure Laterality Date   ABDOMINAL HYSTERECTOMY     BIOPSY  07/03/2020   Procedure: BIOPSY;  Surgeon: Rogene Houston, MD;  [facility];  Service: Endoscopy;;   COLONOSCOPY WITH PROPOFOL N/A 07/03/2020   Procedure: COLONOSCOPY WITH PROPOFOL;  Surgeon: Rogene Houston, MD;  [facility];  Service: Endoscopy;  Laterality: N/A;  730   ESOPHAGOGASTRODUODENOSCOPY (EGD) WITH PROPOFOL N/A 07/03/2020   Procedure: ESOPHAGOGASTRODUODENOSCOPY (EGD) WITH  PROPOFOL;  Surgeon: Rogene Houston, MD;  [facility];  Service: Endoscopy;  Laterality: N/A;   FRACTURE SURGERY     Hx: of left ankle   HARDWARE REMOVAL Left 03/10/2013   Procedure: HARDWARE REMOVAL;  Surgeon: Newt Minion, MD;  Location: [place];  Service: Orthopedics;  Laterality: Left;  Left Ankle Removal Medial Malleolus Screws   HYSTEROTOMY  2017   POLYPECTOMY  07/03/2020   Procedure: POLYPECTOMY;  Surgeon: Rogene Houston, MD;  [facility];  Service: Endoscopy;;   s/p epicondylar release     s/p LEEP procedure  2000 for abnormal pap     Normal since then   s/p uterine ablation  2007   [place]   Social History:  reports that she has never smoked. She has never used smokeless tobacco. She reports that she does not drink alcohol and does not use drugs. Family History:  Family History  Problem Relation Age of Onset   Hypertension Father    Hyperlipidemia Father    Diabetes Father    Colon cancer Paternal Grandfather    Diabetes Mother      HOME MEDICATIONS: Allergies as of 10/17/2021       Reactions   Omeprazole    Augmentin [amoxicillin-pot Clavulanate]    Cleocin [clindamycin Hcl] Diarrhea, Nausea And Vomiting   Frovatriptan Other (See Comments)   parasthesia   Gabapentin (once-daily)    Montelukast Sodium Other (See Comments)   parasthesia   Morphine And Related Nausea And Vomiting   And dizzy, and "feeling terrible"   Propranolol Hcl    unknown   Singulair [montelukast]    Sumatriptan Other (See Comments)   Masked asthma   Vancomycin Other (See Comments)   Had reaction during recovery, maybe hives or rash   Benzoin Rash   And blisters        Medication List        Accurate as of October 17, 2021  7:41 AM. If you have any questions, ask your nurse or doctor.          albuterol 108 (90 Base) MCG/ACT inhaler Commonly known as: VENTOLIN HFA Inhale 2 puffs into the lungs every 6 (six) hours as needed for wheezing.   APPLE CIDER VINEGAR DIET PO Take 1 capsule by mouth daily.   cetirizine 10 MG tablet Commonly known as: ZYRTEC Take 10 mg by mouth daily.   dexlansoprazole 60 MG capsule Commonly known as: Dexilant Take 1 capsule (60 mg total) by mouth daily.   dicyclomine 10 MG capsule Commonly known as: BENTYL Take 1 capsule (10 mg total) by mouth 2 (two) times daily as needed (diarrhea abd pain).   fexofenadine 180 MG tablet Commonly known as: ALLEGRA Take 1 tablet (180 mg total) by mouth every evening. What changed:  when to take  this reasons to take this   hydrochlorothiazide 25 MG tablet Commonly known as: HYDRODIURIL Take 25 mg by mouth daily.   hydrOXYzine 25 MG tablet Commonly known as: ATARAX Take 25 mg by mouth 3 (three) times daily as needed for itching. As needed   losartan 100 MG tablet Commonly known as: COZAAR Take 100 mg by mouth daily.   magnesium gluconate 500 MG tablet Commonly known as: MAGONATE Take 500 mg by mouth every other day.   Melatonin 10 MG Tabs Take 10 mg by mouth every Monday, Tuesday, Wednesday, Thursday, and Friday.   methimazole 5 MG tablet Commonly known as: TAPAZOLE TAKE  1/2 TABLET DAILY   multivitamin with minerals Tabs tablet Take 1 tablet by mouth every evening.   Ozempic (0.25 or 0.5 MG/DOSE) 2 MG/1.5ML Sopn Generic drug: Semaglutide(0.25 or 0.5MG /DOS) Inject 0.5 mg into the skin once a week.   rizatriptan 10 MG tablet Commonly known as: MAXALT Take 1 tablet (10 mg total) by mouth daily as needed for migraine. 1 tablet by mouth for Migraine.  May repeat in 2 hours (no more than 2 in 24 hours or 2-3 days a week).   vitamin C 1000 MG tablet Take 1,000 mg by mouth daily.   Vitamin D 125 MCG (5000 UT) Caps Take 5,000 Units by mouth daily.         OBJECTIVE:   Vital Signs: BP 124/80 (BP Location: Left Arm, Patient Position: Sitting, Cuff Size: Large)    Pulse 84    Ht 5\' 7"  (1.702 m)    Wt 272 lb (123.4 kg)    LMP 02/06/2013    SpO2 99%    BMI 42.60 kg/m   Wt Readings from Last 3 Encounters:  10/17/21 272 lb (123.4 kg)  06/09/21 274 lb (124.3 kg)  06/06/21 271 lb 6.4 oz (123.1 kg)     Exam: General: Pt appears well and is in NAD  Neck: General: Supple without adenopathy. Thyroid: Thyroid size normal.  No goiter or nodules appreciated.   Lungs: Clear with good BS bilat with no rales, rhonchi, or wheezes  Heart: RRR with normal S1 and S2 and no gallops; no murmurs; no rub  Abdomen: Normoactive bowel sounds, soft, nontender, without masses or  organomegaly palpable  Extremities: No pretibial edema.  Neuro: MS is good with appropriate affect, pt is alert and Ox3          DATA REVIEWED:  Lab Results  Component Value Date   HGBA1C 5.7 (A) 10/17/2021   HGBA1C 6.4 05/22/2013   HGBA1C 5.7 07/03/2009   04/09/2021 HDL 43 LDL 129 Tg 142 mg/dL     Latest Reference Range & Units 10/17/21 07:50  TSH 0.35 - 5.50 uIU/mL 1.04  T4,Free(Direct) 0.60 - 1.60 ng/dL 0.79    Thyroid ultrasound 10/17/2021  Parenchymal Echotexture: Moderately heterogenous   Isthmus: 0.3 cm   Right lobe: 4.5 x 1 2 x 3.1 cm   Left lobe: 4.3 x 1.3 x 2.2 cm   _________________________________________________________   Estimated total number of nodules >/= 1 cm: 1   Number of spongiform nodules >/=  2 cm not described below (TR1): 0   Number of mixed cystic and solid nodules >/= 1.5 cm not described below (TR2): 0   _________________________________________________________   Nodule # 3:   Location: Left; mid   Maximum size: 2.3 cm; Other 2 dimensions: 1.6 x 1.2 cm   Composition: solid/almost completely solid (2)   Echogenicity: hypoechoic (2)   Shape: not taller-than-wide (0)   Margins: ill-defined (0)   Echogenic foci: none (0)   ACR TI-RADS total points: 4.   ACR TI-RADS risk category: TR4 (4-6 points).   ACR TI-RADS recommendations:   **Given size (>/= 1.5 cm) and appearance, fine needle aspiration of this moderately suspicious nodule should be considered based on TI-RADS criteria.   _________________________________________________________   Remaining subcentimeter thyroid nodules do not meet criteria for FNA or imaging follow-up.   IMPRESSION: Nodule 3 (TI-RADS 4), measuring 2.3 x 1.6 x 1.2 cm, located in the mid left thyroid lobe meets criteria for FNA.     ASSESSMENT / PLAN / RECOMMENDATIONS:  1) Type 2 Diabetes Mellitus, Optimally controlled, With out complications - Most recent A1c of 5.7 %. Goal A1c < 7.0  %.    -A1c 5.7% which is optimal -She is tolerating Ozempic without side effects, no changes   MEDICATIONS: Continue Ozempic 0.5 mg weekly  EDUCATION / INSTRUCTIONS: BG monitoring instructions: Patient is instructed to check her blood sugars 2 times a week. Call [place] [facility] if: BG persistently < 70 I reviewed the Rule of 15 for the treatment of hypoglycemia in detail with the patient. Literature supplied.     2) Diabetic complications:  Eye: Does not have known diabetic retinopathy.  Neuro/ Feet: Does not have known diabetic peripheral neuropathy .  Renal: Patient does not have known baseline CKD. She   is  on an ACEI/ARB at present.   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 Will start Statin therapy.  - We discussed cardiovascular benefits of statins   Medication  Atorvsatatin 10 mg daily  5) Multinodular Goiter:  -No local neck symptoms -Left mid thyroid nodule 2.3 cm meets FNA criteria, order has been placed -We will consider thyroid uptake and scan  F/U in 6 months   Addendum: Discussed ultrasound results with the patient on 10/17/2021 at 1700.  Patient in agreement to proceed with FNA  Signed electronically by: Mack Guise, MD  [facility] Endocrin[facility]  [facility] [address] [place], [place] [address] Phone: [phone] FAX: [phone]   CC: Beola Cord, [place] [address] Phone: [phone]  Fax: [phone]  Return to Endocrinology clinic as below: Future Appointments  Date Time Provider Icard  10/17/2021  8:30 AM [facility] [place] 1  [facility]  10/17/2021 11:30 AM [facility] [place] [facility]  10/17/2021 12:30 PM [facility] [place] [facility]

## 2021-11-13 ENCOUNTER — Other Ambulatory Visit (HOSPITAL_COMMUNITY)
Admission: RE | Admit: 2021-11-13 | Discharge: 2021-11-13 | Disposition: A | Discharge status: Home / Self Care | Payer: 59 | Source: Ambulatory Visit | Attending: Radiology | Admitting: Radiology

## 2021-11-13 ENCOUNTER — Ambulatory Visit
Admission: RE | Admit: 2021-11-13 | Discharge: 2021-11-13 | Disposition: A | Discharge status: Home / Self Care | Payer: 59 | Source: Ambulatory Visit | Attending: Internal Medicine | Admitting: Internal Medicine

## 2021-11-13 DIAGNOSIS — E042 Nontoxic multinodular goiter: Secondary | ICD-10-CM | POA: Insufficient documentation

## 2021-11-14 LAB — CYTOLOGY - NON PAP

## 2022-01-19 ENCOUNTER — Other Ambulatory Visit: Payer: Self-pay | Admitting: Internal Medicine

## 2022-04-22 ENCOUNTER — Ambulatory Visit: Payer: 59 | Admitting: Internal Medicine

## 2022-06-30 ENCOUNTER — Ambulatory Visit (INDEPENDENT_AMBULATORY_CARE_PROVIDER_SITE_OTHER): Payer: 59 | Admitting: Internal Medicine

## 2022-06-30 VITALS — BP 126/72 | HR 65 | Ht 67.0 in | Wt 274.8 lb

## 2022-06-30 DIAGNOSIS — E785 Hyperlipidemia, unspecified: Secondary | ICD-10-CM | POA: Diagnosis not present

## 2022-06-30 DIAGNOSIS — E119 Type 2 diabetes mellitus without complications: Secondary | ICD-10-CM | POA: Diagnosis not present

## 2022-06-30 DIAGNOSIS — E042 Nontoxic multinodular goiter: Secondary | ICD-10-CM | POA: Diagnosis not present

## 2022-06-30 DIAGNOSIS — E0590 Thyrotoxicosis, unspecified without thyrotoxic crisis or storm: Secondary | ICD-10-CM

## 2022-06-30 LAB — POCT GLYCOSYLATED HEMOGLOBIN (HGB A1C): Hemoglobin A1C: 5.7 % — AB (ref 4.0–5.6)

## 2022-06-30 NOTE — Progress Notes (Unsigned)
Name: Cassidy [name]  Age/ Sex: 51 y.o., female   MRN/ DOB: [id number], 1971-10-26     PCP: Beola Cord, FNP   Reason for Endocrinology Evaluation: Hyperthyroidism/Type 2 Diabetes Mellitus  Initial Endocrine Consultative Visit: 12/05/2020    PATIENT IDENTIFIER: Cassidy [name] is a 51 y.o. female with a past medical history of hyperthyroidism and T2DM. The patient has followed with Endocrinology clinic since 12/05/2020 for consultative assistance with management of her diabetes.  DIABETIC HISTORY:  Cassidy [name] was diagnosed with DM 04/2021, with an A1c 6.6%. She was started on Metfrmin through her PCP but was unable to tolerate.  In 06/2021 we switched  Metformin to Ozempic due to GI issues with Metformin   She had normal 24-hr urinary cortisol for weight gain 06/2021    THYROID HISTORY : She was found to have elevated Anti-TPO Ab at 147 IU/mL ( reference 0-34) and elevated Tg Ab's 642.9 IU/mL ( reference 0.0-0.9). Of note, the pt has normal total T3 at 143 ng/dL but low TSH at 0.26 uIU/mL  And normal FT4 at 1.09 ng/dL .   This was checked during routine check up    On her initial visit to our clinic she was noted to have a low TSH at 0.29 uIU/mL . Due to symptoms of fatigue, arthralgias, eye symptoms of discomfort and pruritic we opted to start methimazole.    TRAB is negative        She was started on Methimazole 12/2020        Mother had questionable thyromegaly , cousin with Grave's disease    Thyroid ultrasound showed questionable nodules    S/P hysterectomy      SUBJECTIVE:   During the last visit (06/06/2021): Switched Metformin to [place]   Today (06/30/2022): Cassidy [name] is here for a follow up on Diabetes and hyperthyroidism.  She checks her blood sugars    Denies nausea, vomiting or diarrhea  Denies local neck swelling  Has occasional palpitations  NO local neck symptoms     HOME ENDOCRINE REGIMEN:  Ozempic 0.5 mg weekly  Methimazole 5 mg , half a tablet daily      Statin: no ACE-I/ARB: yes    METER DOWNLOAD SUMMARY: Did not bring    DIABETIC COMPLICATIONS: Microvascular complications:   Denies: neuropathy , retinopathy  Last Eye Exam: Completed 11/26/2021  Macrovascular complications:   Denies: CAD, CVA, PVD   HISTORY:  Past Medical History:  Past Medical History:  Diagnosis Date   ANEMIA-IRON DEFICIENCY 09/20/2007   ASTHMA 09/20/2007   COMMON MIGRAINE 01/02/2009   FEVER UNSPECIFIED 01/17/2008   FROZEN RIGHT SHOULDER 07/10/2009   GERD 01/02/2009   GLUCOSE INTOLERANCE 01/02/2009   Headache(784.0) 09/20/2007   Heart palpitations    Hx: of with low potassium levels   Hematuria 07/20/2011   HYPERTENSION 01/02/2009   Impaired glucose tolerance 07/17/2011   LATERAL EPICONDYLITIS, RIGHT 06/05/2009   Other and unspecified hyperlipidemia 01/15/2013   PONV (postoperative nausea and vomiting)    ROTATOR CUFF SYNDROME, RIGHT 05/27/2009   SHOULDER PAIN, RIGHT 05/27/2009   Past Surgical History:  Past Surgical History:  Procedure Laterality Date   ABDOMINAL HYSTERECTOMY     BIOPSY  07/03/2020   Procedure: BIOPSY;  Surgeon: Rogene Houston, MD;  [facility];  Service: Endoscopy;;   COLONOSCOPY WITH PROPOFOL N/A 07/03/2020   Procedure: COLONOSCOPY WITH PROPOFOL;  Surgeon: Rogene Houston, MD;  [facility];  Service: Endoscopy;  Laterality: N/A;  730  ESOPHAGOGASTRODUODENOSCOPY (EGD) WITH PROPOFOL N/A 07/03/2020   Procedure: ESOPHAGOGASTRODUODENOSCOPY (EGD) WITH PROPOFOL;  Surgeon: Rogene Houston, MD;  [facility];  Service: Endoscopy;  Laterality: N/A;   FRACTURE SURGERY     Hx: of left ankle   HARDWARE REMOVAL Left 03/10/2013   Procedure: HARDWARE REMOVAL;  Surgeon: Newt Minion, MD;  Location: [place];  Service: Orthopedics;  Laterality: Left;  Left Ankle Removal Medial Malleolus Screws   HYSTEROTOMY  2017   POLYPECTOMY  07/03/2020   Procedure: POLYPECTOMY;  Surgeon: Rogene Houston, MD;  [facility];   Service: Endoscopy;;   s/p epicondylar release     s/p LEEP procedure 2000 for abnormal pap     Normal since then   s/p uterine ablation  2007   [place]   Social History:  reports that she has never smoked. She has never used smokeless tobacco. She reports that she does not drink alcohol and does not use drugs. Family History:  Family History  Problem Relation Age of Onset   Hypertension Father    Hyperlipidemia Father    Diabetes Father    Colon cancer Paternal Grandfather    Diabetes Mother      HOME MEDICATIONS: Allergies as of 06/30/2022       Reactions   Omeprazole    Augmentin [amoxicillin-pot Clavulanate]    Cleocin [clindamycin Hcl] Diarrhea, Nausea And Vomiting   Frovatriptan Other (See Comments)   parasthesia   Gabapentin (once-daily)    Montelukast Sodium Other (See Comments)   parasthesia   Morphine And Related Nausea And Vomiting   And dizzy, and "feeling terrible"   Propranolol Hcl    unknown   Singulair [montelukast]    Sumatriptan Other (See Comments)   Masked asthma   Vancomycin Other (See Comments)   Had reaction during recovery, maybe hives or rash   Benzoin Rash   And blisters        Medication List        Accurate as of June 30, 2022  3:21 PM. If you have any questions, ask your nurse or doctor.          albuterol 108 (90 Base) MCG/ACT inhaler Commonly known as: VENTOLIN HFA Inhale 2 puffs into the lungs every 6 (six) hours as needed for wheezing.   APPLE CIDER VINEGAR DIET PO Take 1 capsule by mouth daily.   atorvastatin 10 MG tablet Commonly known as: LIPITOR Take 1 tablet (10 mg total) by mouth daily.   cetirizine 10 MG tablet Commonly known as: ZYRTEC Take 10 mg by mouth daily.   dexlansoprazole 60 MG capsule Commonly known as: Dexilant Take 1 capsule (60 mg total) by mouth daily.   dicyclomine 10 MG capsule Commonly known as: BENTYL Take 1 capsule (10 mg total) by mouth 2 (two) times daily as needed  (diarrhea abd pain).   fexofenadine 180 MG tablet Commonly known as: ALLEGRA Take 1 tablet (180 mg total) by mouth every evening. What changed:  when to take this reasons to take this   hydrochlorothiazide 25 MG tablet Commonly known as: HYDRODIURIL Take 25 mg by mouth daily.   hydrOXYzine 25 MG tablet Commonly known as: ATARAX Take 25 mg by mouth 3 (three) times daily as needed for itching. As needed   losartan 100 MG tablet Commonly known as: COZAAR Take 100 mg by mouth daily.   magnesium gluconate 500 MG tablet Commonly known as: MAGONATE Take 500 mg by mouth every  other day.   Melatonin 10 MG Tabs Take 10 mg by mouth every Monday, Tuesday, Wednesday, Thursday, and Friday.   methimazole 5 MG tablet Commonly known as: TAPAZOLE TAKE 1/2 TABLET DAILY   multivitamin with minerals Tabs tablet Take 1 tablet by mouth every evening.   Ozempic (0.25 or 0.5 MG/DOSE) 2 MG/1.5ML Sopn Generic drug: Semaglutide(0.25 or 0.'5MG'$ /DOS) Inject 0.5 mg into the skin once a week.   rizatriptan 10 MG tablet Commonly known as: MAXALT Take 1 tablet (10 mg total) by mouth daily as needed for migraine. 1 tablet by mouth for Migraine.  May repeat in 2 hours (no more than 2 in 24 hours or 2-3 days a week).   vitamin C 1000 MG tablet Take 1,000 mg by mouth daily.   Vitamin D 125 MCG (5000 UT) Caps Take 5,000 Units by mouth daily.         OBJECTIVE:   Vital Signs: BP 126/72 (BP Location: Left Arm, Patient Position: Sitting, Cuff Size: Normal)   Pulse 65   Ht '5\' 7"'$  (1.702 m)   Wt 274 lb 12.8 oz (124.6 kg)   LMP 02/06/2013   SpO2 99%   BMI 43.04 kg/m   Wt Readings from Last 3 Encounters:  06/30/22 274 lb 12.8 oz (124.6 kg)  10/17/21 272 lb (123.4 kg)  06/09/21 274 lb (124.3 kg)     Exam: General: Pt appears well and is in NAD  Neck: General: Supple without adenopathy. Thyroid: Thyroid size normal.  No goiter or nodules appreciated.   Lungs: Clear with good BS bilat with no  rales, rhonchi, or wheezes  Heart: RRR with normal S1 and S2 and no gallops; no murmurs; no rub  Abdomen: Normoactive bowel sounds, soft, nontender, without masses or organomegaly palpable  Extremities: No pretibial edema.  Neuro: MS is good with appropriate affect, pt is alert and Ox3      DM Foot Exam 06/30/2022 The skin of the feet is intact without sores or ulcerations. The pedal pulses are 2+ on right and 2+ on left. The sensation is intact to a screening 5.07, 10 gram monofilament bilaterally     DATA REVIEWED:  Lab Results  Component Value Date   HGBA1C 5.7 (A) 06/30/2022   HGBA1C 5.7 (A) 10/17/2021   HGBA1C 6.4 05/22/2013   04/09/2021 HDL 43 LDL 129 Tg 142 mg/dL     Latest Reference Range & Units 10/17/21 07:50  TSH 0.35 - 5.50 uIU/mL 1.04  T4,Free(Direct) 0.60 - 1.60 ng/dL 0.79    Thyroid ultrasound 10/17/2021  Parenchymal Echotexture: Moderately heterogenous   Isthmus: 0.3 cm   Right lobe: 4.5 x 1 2 x 3.1 cm   Left lobe: 4.3 x 1.3 x 2.2 cm   _________________________________________________________   Estimated total number of nodules >/= 1 cm: 1   Number of spongiform nodules >/=  2 cm not described below (TR1): 0   Number of mixed cystic and solid nodules >/= 1.5 cm not described below (TR2): 0   _________________________________________________________   Nodule # 3:   Location: Left; mid   Maximum size: 2.3 cm; Other 2 dimensions: 1.6 x 1.2 cm   Composition: solid/almost completely solid (2)   Echogenicity: hypoechoic (2)   Shape: not taller-than-wide (0)   Margins: ill-defined (0)   Echogenic foci: none (0)   ACR TI-RADS total points: 4.   ACR TI-RADS risk category: TR4 (4-6 points).   ACR TI-RADS recommendations:   **Given size (>/= 1.5 cm) and appearance, fine needle  aspiration of this moderately suspicious nodule should be considered based on TI-RADS criteria.   _________________________________________________________    Remaining subcentimeter thyroid nodules do not meet criteria for FNA or imaging follow-up.   IMPRESSION: Nodule 3 (TI-RADS 4), measuring 2.3 x 1.6 x 1.2 cm, located in the mid left thyroid lobe meets criteria for FNA.   Left nodule FNA 11/13/2021    Clinical History: Left mid, 2.3 cm; Other 2 dimensions: 1.6 x 1.2 cm,  Solid/almost completely solid, Hypoechoic, TI-RADS total points: 4  Specimen Submitted:  A. THYROID, LEFT MID NODULE # 3, FINE NEEDLE  ASPIRATION:    FINAL MICROSCOPIC DIAGNOSIS:  - Consistent with benign follicular nodule ([employer] category II)  ASSESSMENT / PLAN / RECOMMENDATIONS:   1) Type 2 Diabetes Mellitus, Optimally controlled, With out complications - Most recent A1c of 5.7 %. Goal A1c < 7.0 %.    -A1c 5.7% which is optimal -She is tolerating Ozempic without side effects, no changes   MEDICATIONS: Continue Ozempic 0.5 mg weekly  EDUCATION / INSTRUCTIONS: BG monitoring instructions: Patient is instructed to check her blood sugars 2 times a week. Call [place] [facility] clinic if: BG persistently < 70 I reviewed the Rule of 15 for the treatment of hypoglycemia in detail with the patient. Literature supplied.     2) Diabetic complications:  Eye: Does not have known diabetic retinopathy.  Neuro/ Feet: Does not have known diabetic peripheral neuropathy .  Renal: Patient does not have known baseline CKD. She   is  on an ACEI/ARB at present.   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 Will start Statin therapy.  - We discussed cardiovascular benefits of statins   Medication  Atorvsatatin 10 mg daily  5) Multinodular  Goiter:  -No local neck symptoms -Left mid thyroid nodule 2.3 cm meets FNA criteria, order has been placed -We will consider thyroid uptake and scan  F/U in 6 months     Signed electronically by: Mack Guise, MD  [facility]  [facility] [address], [place] [address] Phone: [phone] FAX: [phone]   CC: Be[address] Cord, [address] [place] [address] Phone: [phone]  Fax: [phone]  Return to Endocrinology clinic as below: No future appointments.

## 2022-07-01 LAB — COMPREHENSIVE METABOLIC PANEL
ALT: 19 U/L (ref 0–35)
AST: 20 U/L (ref 0–37)
Albumin: 3.8 g/dL (ref 3.5–5.2)
Alkaline Phosphatase: 72 U/L (ref 39–117)
BUN: 11 mg/dL (ref 6–23)
CO2: 31 mEq/L (ref 19–32)
Calcium: 9.1 mg/dL (ref 8.4–10.5)
Chloride: 101 mEq/L (ref 96–112)
Creatinine, Ser: 0.9 mg/dL (ref 0.40–1.20)
GFR: 75.28 mL/min (ref >60.00)
Glucose, Bld: 89 mg/dL (ref 70–99)
Potassium: 3.6 mEq/L (ref 3.5–5.1)
Sodium: 139 mEq/L (ref 135–145)
Total Bilirubin: 0.3 mg/dL (ref 0.2–1.2)
Total Protein: 7.1 g/dL (ref 6.0–8.3)

## 2022-07-01 LAB — LIPID PANEL
Cholesterol: 153 mg/dL (ref 0–200)
HDL: 49.7 mg/dL (ref >39.00)
LDL Cholesterol: 76 mg/dL (ref 0–99)
NonHDL: 103.53
Total CHOL/HDL Ratio: 3
Triglycerides: 137 mg/dL (ref 0.0–149.0)
VLDL: 27.4 mg/dL (ref 0.0–40.0)

## 2022-07-01 LAB — TSH: TSH: 0.93 u[IU]/mL (ref 0.35–5.50)

## 2022-07-01 LAB — MICROALBUMIN / CREATININE URINE RATIO
Creatinine,U: 129.2 mg/dL
Microalb Creat Ratio: 0.5 mg/g (ref 0.0–30.0)
Microalb, Ur: <0.7 mg/dL (ref 0.0–1.9)

## 2022-07-01 LAB — T4, FREE: Free T4: 0.83 ng/dL (ref 0.60–1.60)

## 2022-07-01 MED ORDER — METHIMAZOLE 5 MG PO TABS: 2.5000 mg | ORAL_TABLET | Freq: Every day | ORAL | 3 refills | Status: DC

## 2022-07-01 MED ORDER — ATORVASTATIN CALCIUM 10 MG PO TABS: 10.0000 mg | ORAL_TABLET | Freq: Every day | ORAL | 3 refills | Status: DC

## 2022-07-01 MED ORDER — SEMAGLUTIDE(0.25 OR 0.5MG/DOS) 2 MG/3ML ~~LOC~~ SOPN: 0.5000 mg | PEN_INJECTOR | SUBCUTANEOUS | 3 refills | Status: DC

## 2022-08-02 ENCOUNTER — Encounter: Payer: Self-pay | Admitting: Internal Medicine

## 2022-08-03 ENCOUNTER — Other Ambulatory Visit: Payer: Self-pay

## 2022-08-03 ENCOUNTER — Telehealth: Payer: Self-pay

## 2022-08-03 ENCOUNTER — Other Ambulatory Visit (HOSPITAL_COMMUNITY): Payer: Self-pay

## 2022-08-03 MED ORDER — SEMAGLUTIDE(0.25 OR 0.5MG/DOS) 2 MG/3ML ~~LOC~~ SOPN: 0.5000 mg | PEN_INJECTOR | SUBCUTANEOUS | 3 refills | Status: DC

## 2022-08-03 NOTE — Telephone Encounter (Signed)
  Received notification from [facility] that prior authorization is required for Ozempic (0.25 or 0.5 MG/DOSE) '2MG'$ /3ML pen-injectors. PA submitted and APPROVED on 08/03/22.  Key  [id number] Effective: 08/03/22 - 08/02/2025

## 2022-11-30 LAB — HM DIABETES EYE EXAM

## 2022-12-01 ENCOUNTER — Encounter: Payer: Self-pay | Admitting: Internal Medicine

## 2023-01-01 ENCOUNTER — Encounter: Payer: Self-pay | Admitting: Internal Medicine

## 2023-01-01 ENCOUNTER — Ambulatory Visit (INDEPENDENT_AMBULATORY_CARE_PROVIDER_SITE_OTHER): Payer: BC Managed Care – PPO | Admitting: Internal Medicine

## 2023-01-01 VITALS — BP 122/78 | HR 82 | Ht 67.0 in | Wt 272.0 lb

## 2023-01-01 DIAGNOSIS — E785 Hyperlipidemia, unspecified: Secondary | ICD-10-CM | POA: Diagnosis not present

## 2023-01-01 DIAGNOSIS — E0590 Thyrotoxicosis, unspecified without thyrotoxic crisis or storm: Secondary | ICD-10-CM

## 2023-01-01 DIAGNOSIS — E119 Type 2 diabetes mellitus without complications: Secondary | ICD-10-CM

## 2023-01-01 DIAGNOSIS — E042 Nontoxic multinodular goiter: Secondary | ICD-10-CM | POA: Diagnosis not present

## 2023-01-01 LAB — COMPREHENSIVE METABOLIC PANEL
ALT: 35 U/L (ref 0–35)
AST: 22 U/L (ref 0–37)
Albumin: 3.9 g/dL (ref 3.5–5.2)
Alkaline Phosphatase: 73 U/L (ref 39–117)
BUN: 16 mg/dL (ref 6–23)
CO2: 31 mEq/L (ref 19–32)
Calcium: 9.9 mg/dL (ref 8.4–10.5)
Chloride: 100 mEq/L (ref 96–112)
Creatinine, Ser: 0.91 mg/dL (ref 0.40–1.20)
GFR: 74.03 mL/min (ref >60.00)
Glucose, Bld: 115 mg/dL — ABNORMAL HIGH (ref 70–99)
Potassium: 3.6 mEq/L (ref 3.5–5.1)
Sodium: 139 mEq/L (ref 135–145)
Total Bilirubin: 0.3 mg/dL (ref 0.2–1.2)
Total Protein: 7.4 g/dL (ref 6.0–8.3)

## 2023-01-01 LAB — LIPID PANEL
Cholesterol: 180 mg/dL (ref 0–200)
HDL: 51.4 mg/dL (ref >39.00)
LDL Cholesterol: 102 mg/dL — ABNORMAL HIGH (ref 0–99)
NonHDL: 128.63
Total CHOL/HDL Ratio: 4
Triglycerides: 134 mg/dL (ref 0.0–149.0)
VLDL: 26.8 mg/dL (ref 0.0–40.0)

## 2023-01-01 LAB — POCT GLUCOSE (DEVICE FOR HOME USE): Glucose Fasting, POC: 145 mg/dL — AB (ref 70–99)

## 2023-01-01 LAB — POCT GLYCOSYLATED HEMOGLOBIN (HGB A1C): Hemoglobin A1C: 5.9 % — AB (ref 4.0–5.6)

## 2023-01-01 LAB — T4, FREE: Free T4: 0.66 ng/dL (ref 0.60–1.60)

## 2023-01-01 LAB — TSH: TSH: 1.03 u[IU]/mL (ref 0.35–5.50)

## 2023-01-01 MED ORDER — METHIMAZOLE 5 MG PO TABS: 5.0000 mg | ORAL_TABLET | ORAL | 3 refills | Status: DC

## 2023-01-01 MED ORDER — ATORVASTATIN CALCIUM 10 MG PO TABS: 10.0000 mg | ORAL_TABLET | Freq: Every day | ORAL | 3 refills | Status: DC

## 2023-01-01 MED ORDER — SEMAGLUTIDE(0.25 OR 0.5MG/DOS) 2 MG/3ML ~~LOC~~ SOPN: 0.5000 mg | PEN_INJECTOR | SUBCUTANEOUS | 3 refills | Status: DC

## 2023-01-01 NOTE — Progress Notes (Signed)
Name: Cassidy [name]  Age/ Sex: 52 y.o., female   MRN/ DOB: [id number], [birth year]     PCP: Beola Cord, FNP   Reason for Endocrinology Evaluation: Hyperthyroidism/Type 2 Diabetes Mellitus  Initial Endocrine Consultative Visit: 12/05/2020    PATIENT IDENTIFIER: [name] is a 52 y.o. female with a past medical history of hyperthyroidism and T2DM. The patient has followed with Endocrinology clinic since 12/05/2020 for consultative assistance with management of her diabetes.  DIABETIC HISTORY:  [name] was diagnosed with DM 04/2021, with an A1c 6.6%. She was started on Metfrmin through her PCP but was unable to tolerate.  In 06/2021 we switched  Metformin to Ozempic due to GI issues with Metformin   She had normal 24-hr urinary cortisol for weight gain 06/2021    THYROID HISTORY : She was found to have elevated Anti-TPO Ab at 147 IU/mL ( reference 0-34) and elevated Tg Ab's 642.9 IU/mL ( reference 0.0-0.9). Of note, the pt has normal total T3 at 143 ng/dL but low TSH at 0.26 uIU/mL  And normal FT4 at 1.09 ng/dL .   This was checked during routine check up    On her initial visit to our clinic she was noted to have a low TSH at 0.29 uIU/mL . Due to symptoms of fatigue, arthralgias, eye symptoms of discomfort and pruritic we opted to start methimazole.    TRAB is negative        She was started on Methimazole 12/2020   S/P left mid thyroid nodule 2.3 cm with benign cytology 11/2021     Mother had questionable thyromegaly , cousin with Grave's disease    Thyroid ultrasound showed questionable nodules    S/P hysterectomy      SUBJECTIVE:   During the last visit (06/30/2022): A1c 5.9%   Today (01/01/2023): [name] is here for a follow up on Diabetes and hyperthyroidism.  She checks her blood sugars    Denies nausea, vomiting or diarrhea  Denies palpitations  NO local neck symptoms  Lost grandmother end of 2023 Denies tingling or numbness of the feet    HOME ENDOCRINE  REGIMEN:  Ozempic 0.5 mg weekly  Methimazole 5 mg , half a tablet daily  Atorvastatin 10 mg daily    Statin: no ACE-I/ARB: yes    METER DOWNLOAD later in the day: Did not bring    DIABETIC COMPLICATIONS: Microvascular complications:   Denies: neuropathy , retinopathy  Last Eye Exam: Completed 11/2022  Macrovascular complications:   Denies: CAD, CVA, PVD   HISTORY:  Past Medical History:  Past Medical History:  Diagnosis Date   ANEMIA-IRON DEFICIENCY 09/20/2007   ASTHMA 09/20/2007   COMMON MIGRAINE 01/02/2009   FEVER UNSPECIFIED 01/17/2008   FROZEN RIGHT SHOULDER 07/10/2009   GERD 01/02/2009   GLUCOSE INTOLERANCE 01/02/2009   Headache(784.0) 09/20/2007   Heart palpitations    Hx: of with low potassium levels   Hematuria 07/20/2011   HYPERTENSION 01/02/2009   Impaired glucose tolerance 07/17/2011   LATERAL EPICONDYLITIS, RIGHT 06/05/2009   Other and unspecified hyperlipidemia 01/15/2013   PONV (postoperative nausea and vomiting)    ROTATOR CUFF SYNDROME, RIGHT 05/27/2009   SHOULDER PAIN, RIGHT 05/27/2009   Past Surgical History:  Past Surgical History:  Procedure Laterality Date   ABDOMINAL HYSTERECTOMY     BIOPSY  07/03/2020   Procedure: BIOPSY;  Surgeon: Rogene Houston, MD;  [facility];  Service: Endoscopy;;   COLONOSCOPY WITH PROPOFOL N/A 07/03/2020   Procedure: COLONOSCOPY  WITH PROPOFOL;  Surgeon: Rogene Houston, MD;  [facility];  Service: Endoscopy;  Laterality: N/A;  730   ESOPHAGOGASTRODUODENOSCOPY (EGD) WITH PROPOFOL N/A 07/03/2020   Procedure: ESOPHAGOGASTRODUODENOSCOPY (EGD) WITH PROPOFOL;  Surgeon: Rogene Houston, MD;  [facility]: [facility];  Service: Endoscopy;  Laterality: N/A;   FRACTURE SURGERY     Hx: of left ankle   HARDWARE REMOVAL Left 03/10/2013   Procedure: HARDWARE REMOVAL;  Surgeon: Newt Minion, MD;  Location: [facility];  Service: Orthopedics;  Laterality: Left;  Left Ankle Removal Medial Malleolus Screws   HYSTEROTOMY  2017    POLYPECTOMY  07/03/2020   Procedure: POLYPECTOMY;  Surgeon: Rogene Houston, MD;  [facility]: [facility];  Service: Endoscopy;;   s/p epicondylar release     s/p LEEP procedure 2000 for abnormal pap     Normal since then   s/p uterine ablation  2007   [place]   Social History:  reports that she has never smoked. She has never used smokeless tobacco. She reports that she does not drink alcohol and does not use drugs. Family History:  Family History  Problem Relation Age of Onset   Hypertension Father    Hyperlipidemia Father    Diabetes Father    Colon cancer Paternal Grandfather    Diabetes Mother      HOME MEDICATIONS: Allergies as of 01/01/2023       Reactions   Omeprazole    Augmentin [amoxicillin-pot Clavulanate]    Cleocin [clindamycin Hcl] Diarrhea, Nausea And Vomiting   Frovatriptan Other (See Comments)   parasthesia   Gabapentin (once-daily)    Montelukast Sodium Other (See Comments)   parasthesia   Morphine And Related Nausea And Vomiting   And dizzy, and "feeling terrible"   Propranolol Hcl    unknown   Singulair [montelukast]    Sumatriptan Other (See Comments)   Masked asthma   Vancomycin Other (See Comments)   Had reaction during recovery, maybe hives or rash   Benzoin Rash   And blisters        Medication List        Accurate as of January 01, 2023  7:00 AM. If you have any questions, ask your nurse or doctor.          albuterol 108 (90 Base) MCG/ACT inhaler Commonly known as: VENTOLIN HFA Inhale 2 puffs into the lungs every 6 (six) hours as needed for wheezing.   APPLE CIDER VINEGAR DIET PO Take 1 capsule by mouth daily.   atorvastatin 10 MG tablet Commonly known as: LIPITOR Take 1 tablet (10 mg total) by mouth daily.   cetirizine 10 MG tablet Commonly known as: ZYRTEC Take 10 mg by mouth daily.   dexlansoprazole 60 MG capsule Commonly known as: Dexilant Take 1 capsule (60 mg total) by mouth daily.   dicyclomine 10 MG  capsule Commonly known as: BENTYL Take 1 capsule (10 mg total) by mouth 2 (two) times daily as needed (diarrhea abd pain).   fexofenadine 180 MG tablet Commonly known as: ALLEGRA Take 1 tablet (180 mg total) by mouth every evening. What changed:  when to take this reasons to take this   hydrochlorothiazide 25 MG tablet Commonly known as: HYDRODIURIL Take 25 mg by mouth daily.   hydrOXYzine 25 MG tablet Commonly known as: ATARAX Take 25 mg by mouth 3 (three) times daily as needed for itching. As needed   losartan 100 MG tablet Commonly known as: COZAAR  Take 100 mg by mouth daily.   magnesium gluconate 500 MG tablet Commonly known as: MAGONATE Take 500 mg by mouth every other day.   Melatonin 10 MG Tabs Take 10 mg by mouth every Monday, Tuesday, Wednesday, Thursday, and Friday.   methimazole 5 MG tablet Commonly known as: TAPAZOLE Take 0.5 tablets (2.5 mg total) by mouth daily.   multivitamin with minerals Tabs tablet Take 1 tablet by mouth every evening.   rizatriptan 10 MG tablet Commonly known as: MAXALT Take 1 tablet (10 mg total) by mouth daily as needed for migraine. 1 tablet by mouth for Migraine.  May repeat in 2 hours (no more than 2 in 24 hours or 2-3 days a week).   Semaglutide(0.25 or 0.'5MG'$ /DOS) 2 MG/3ML Sopn Inject 0.5 mg into the skin once a week.   vitamin C 1000 MG tablet Take 1,000 mg by mouth daily.   Vitamin D 125 MCG (5000 UT) Caps Take 5,000 Units by mouth daily.         OBJECTIVE:   Vital Signs: LMP 02/06/2013   Wt Readings from Last 3 Encounters:  06/30/22 274 lb 12.8 oz (124.6 kg)  10/17/21 272 lb (123.4 kg)  06/09/21 274 lb (124.3 kg)     Exam: General: Pt appears well and is in NAD  Neck: General: Supple without adenopathy. Thyroid: Thyroid size normal.  No goiter or nodules appreciated.   Lungs: Clear with good BS bilat with no rales, rhonchi, or wheezes  Heart: RRR  Abdomen: soft, nontender  Extremities: No pretibial  edema.  Neuro: MS is good with appropriate affect, pt is alert and Ox3      DM Foot Exam 06/30/2022 The skin of the feet is intact without sores or ulcerations. The pedal pulses are 2+ on right and 2+ on left. The sensation is intact to a screening 5.07, 10 gram monofilament bilaterally     DATA REVIEWED:  Lab Results  Component Value Date   HGBA1C 5.7 (A) 06/30/2022   HGBA1C 5.7 (A) 10/17/2021   HGBA1C 6.4 05/22/2013    Latest Reference Range & Units 01/01/23 08:08  Sodium 135 - 145 mEq/L 139  Potassium 3.5 - 5.1 mEq/L 3.6  Chloride 96 - 112 mEq/L 100  CO2 19 - 32 mEq/L 31  Glucose 70 - 99 mg/dL 115 (H)  BUN 6 - 23 mg/dL 16  Creatinine 0.40 - 1.20 mg/dL 0.91  Calcium 8.4 - 10.5 mg/dL 9.9  Alkaline Phosphatase 39 - 117 U/L 73  Albumin 3.5 - 5.2 g/dL 3.9  AST 0 - 37 U/L 22  ALT 0 - 35 U/L 35  Total Protein 6.0 - 8.3 g/dL 7.4  Total Bilirubin 0.2 - 1.2 mg/dL 0.3  GFR >60.00 mL/min 74.03    Latest Reference Range & Units 01/01/23 08:08  Total CHOL/HDL Ratio  4  Cholesterol 0 - 200 mg/dL 180  HDL Cholesterol >39.00 mg/dL 51.40  LDL (calc) 0 - 99 mg/dL 102 (H)  NonHDL  128.63  Triglycerides 0.0 - 149.0 mg/dL 134.0  VLDL 0.0 - 40.0 mg/dL 26.8     Latest Reference Range & Units 01/01/23 08:08  TSH 0.35 - 5.50 uIU/mL 1.03  T4,Free(Direct) 0.60 - 1.60 ng/dL 0.66    Thyroid ultrasound 10/17/2021  Parenchymal Echotexture: Moderately heterogenous   Isthmus: 0.3 cm   Right lobe: 4.5 x 1 2 x 3.1 cm   Left lobe: 4.3 x 1.3 x 2.2 cm   _________________________________________________________   Estimated total number of nodules >/= 1  cm: 1   Number of spongiform nodules >/=  2 cm not described below (TR1): 0   Number of mixed cystic and solid nodules >/= 1.5 cm not described below ([place]): 0   _________________________________________________________   Nodule # 3:   Location: Left; mid   Maximum size: 2.3 cm; Other 2 dimensions: 1.6 x 1.2 cm   Composition:  solid/almost completely solid (2)   Echogenicity: hypoechoic (2)   Shape: not taller-than-wide (0)   Margins: ill-defined (0)   Echogenic foci: none (0)   ACR TI-RADS total points: 4.   ACR TI-RADS risk category: TR4 (4-6 points).   ACR TI-RADS recommendations:   **Given size (>/= 1.5 cm) and appearance, fine needle aspiration of this moderately suspicious nodule should be considered based on TI-RADS criteria.   _________________________________________________________   Remaining subcentimeter thyroid nodules do not meet criteria for FNA or imaging follow-up.   IMPRESSION: Nodule 3 (TI-RADS 4), measuring 2.3 x 1.6 x 1.2 cm, located in the mid left thyroid lobe meets criteria for FNA.   Left nodule FNA 11/13/2021    Clinical History: Left mid, 2.3 cm; Other 2 dimensions: 1.6 x 1.2 cm,  Solid/almost completely solid, Hypoechoic, TI-RADS total points: 4  Specimen Submitted:  A. THYROID, LEFT MID NODULE # 3, FINE NEEDLE  ASPIRATION:    FINAL MICROSCOPIC DIAGNOSIS:  - Consistent with benign follicular nodule (Bethesda category II)  ASSESSMENT / PLAN / RECOMMENDATIONS:   1) Type 2 Diabetes Mellitus, Optimally controlled, Without complications - Most recent A1c of 5.9 %. Goal A1c < 7.0 %.    -A1c 5.9% which is optimal -   MEDICATIONS: Continue Ozempic 0.5 mg weekly  EDUCATION / INSTRUCTIONS: BG monitoring instructions: Patient is instructed to check her blood sugars 2 times a week.    2) Diabetic complications:  Eye: Does not have known diabetic retinopathy.  Neuro/ Feet: Does not have known diabetic peripheral neuropathy .  Renal: Patient does not have known baseline CKD. She   is  on an ACEI/ARB at present.   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 -She has elevated anti-TPO antibody, but TRAB levels undetectable -She is clinically euthyroid -No local neck symptoms -Repeat TFTs today are normal, will decrease the dose as below      Medications    Decrease  methimazole 5 mg, 1 tablet 3 days a week    4) Dyslipidemia    -LDL has trended up -Will encourage low-fat diet, if this persist, will increase atorvastatin  Medication  Atorvsatatin 10 mg daily  5) Multinodular Goiter:  -No local neck symptoms -She is  S/P left mid thyroid nodule 2.3 cm with benign cytology 11/2021 -We will proceed with repeat thyroid ultrasound  F/U in 6 months     Signed electronically by: Mack Guise, MD  [facility] Endocrinology  [facility] [address] [place], [place] [address] Phone: [phone] FAX: [phone]   CC: Beola Cord, [address] [place] [address] Phone: [phone]  Fax: [phone]  Return to Endocrinology clinic as below: Future Appointments  Date Time Provider [place]  01/01/2023  7:50 AM Lorenzo Pereyra, Melanie Crazier, MD [facility] None  01/28/2023  8:15 AM Carlan, Deatra Robinson, NP [facility] None

## 2023-01-28 ENCOUNTER — Ambulatory Visit (INDEPENDENT_AMBULATORY_CARE_PROVIDER_SITE_OTHER): Payer: 59 | Admitting: Gastroenterology

## 2023-03-08 ENCOUNTER — Other Ambulatory Visit: Payer: BC Managed Care – PPO

## 2023-05-05 ENCOUNTER — Other Ambulatory Visit: Payer: BC Managed Care – PPO

## 2023-05-25 IMAGING — US US THYROID
1 series · 13 of 25 positions shown · non-contrast
Comparison: None.

CLINICAL DATA: Hyperthyroidism

EXAM:
THYROID ULTRASOUND
TECHNIQUE: Ultrasound examination of the thyroid gland and adjacent soft
tissues was performed.

[Series 1: us thyroid · 0.07mm/px · 13 of 42 slices shown]
[im 1/42]
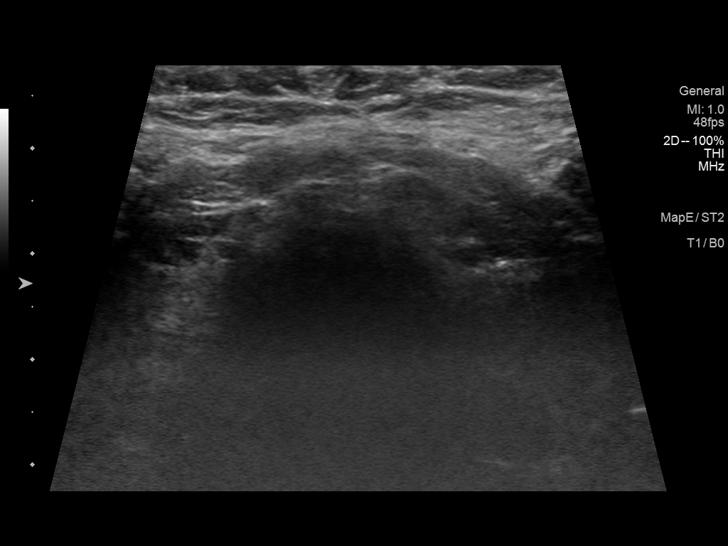
[im 4/42]
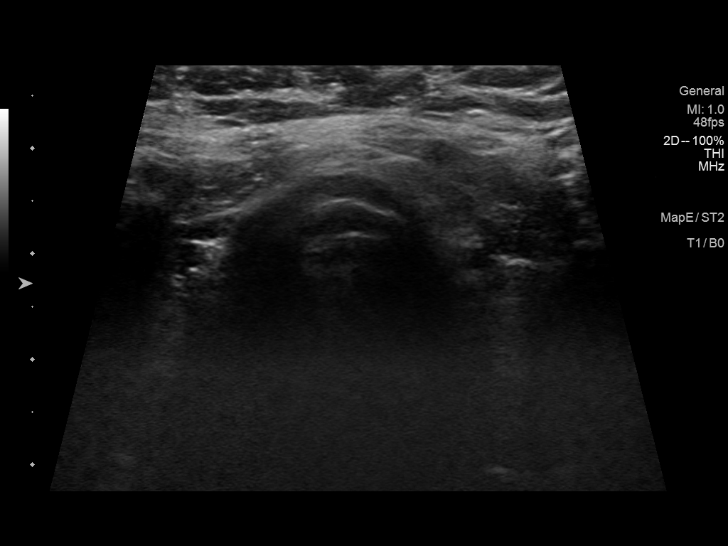
[im 7/42]
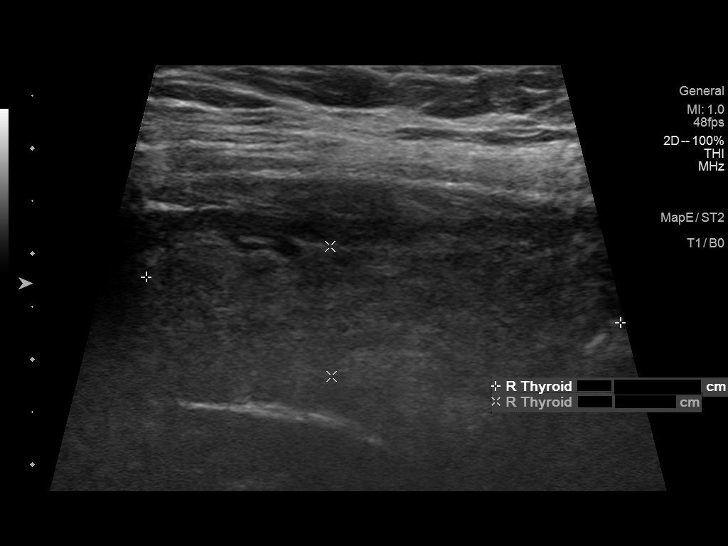
[im 11/42]
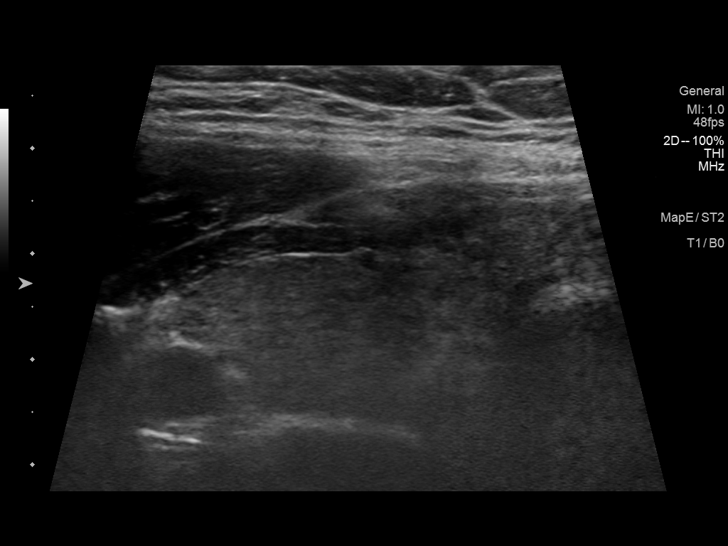
[im 14/42]
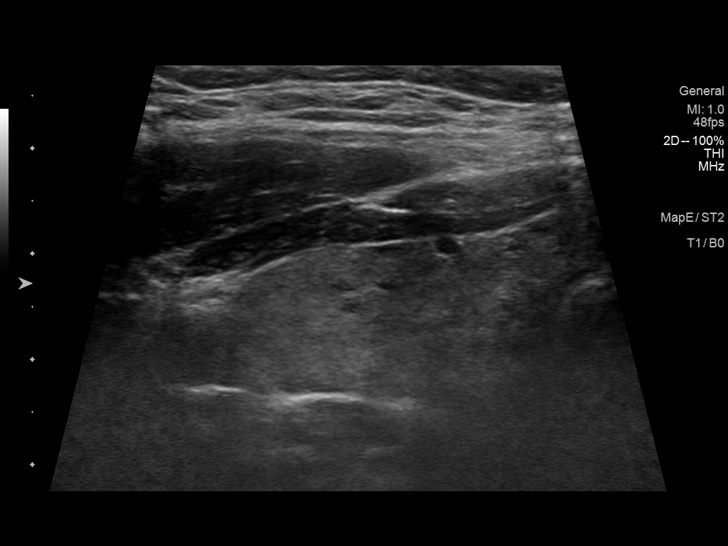
[im 18/42]
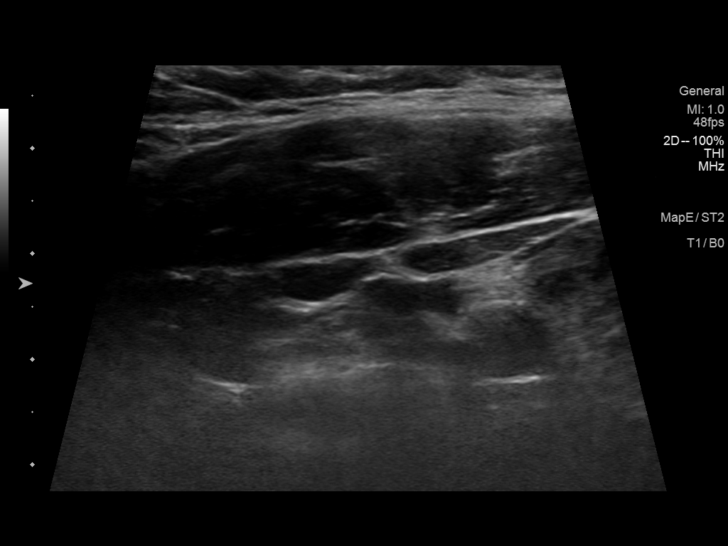
[im 21/42]
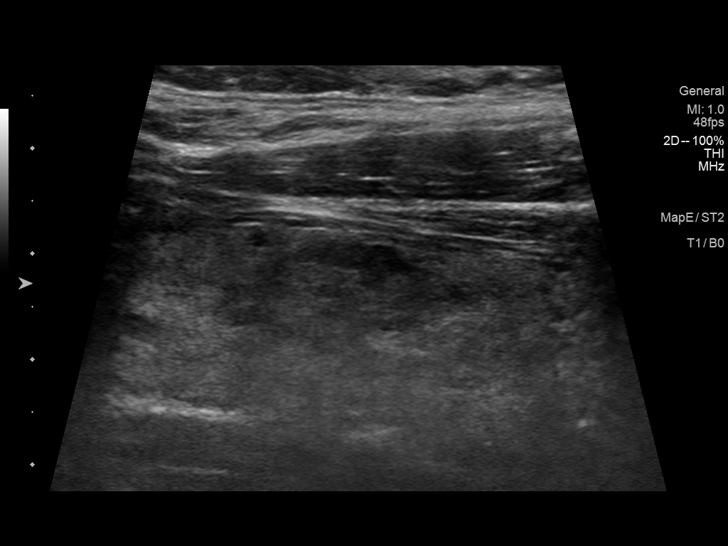
[im 24/42]
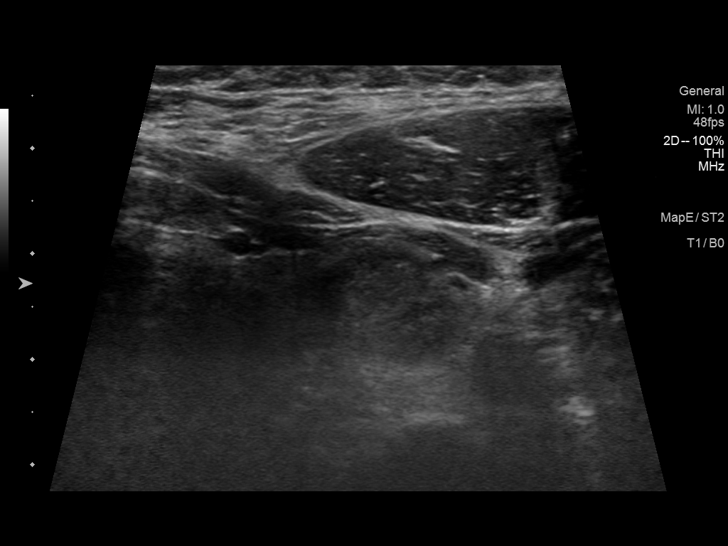
[im 28/42]
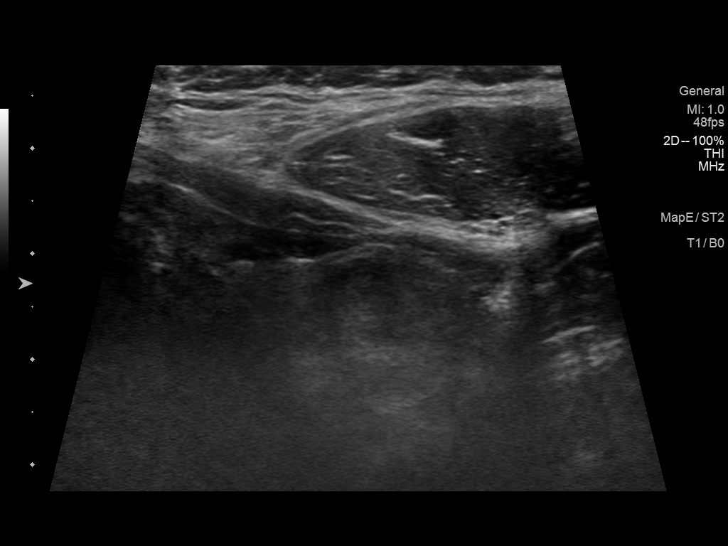
[im 31/42]
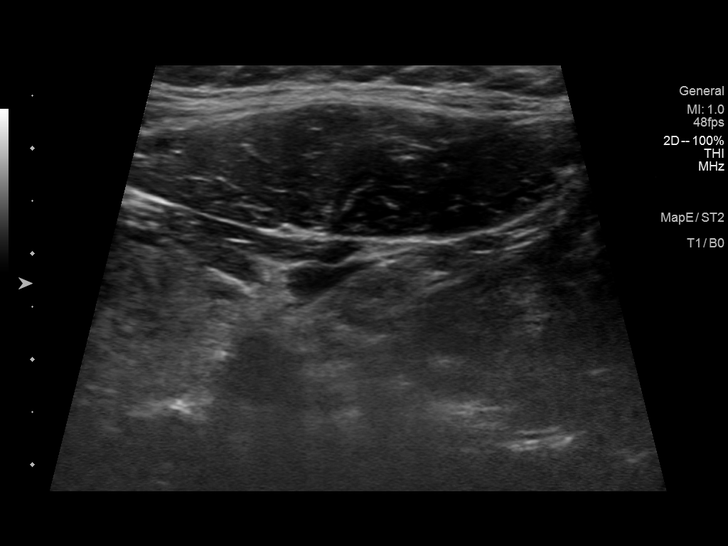
[im 35/42]
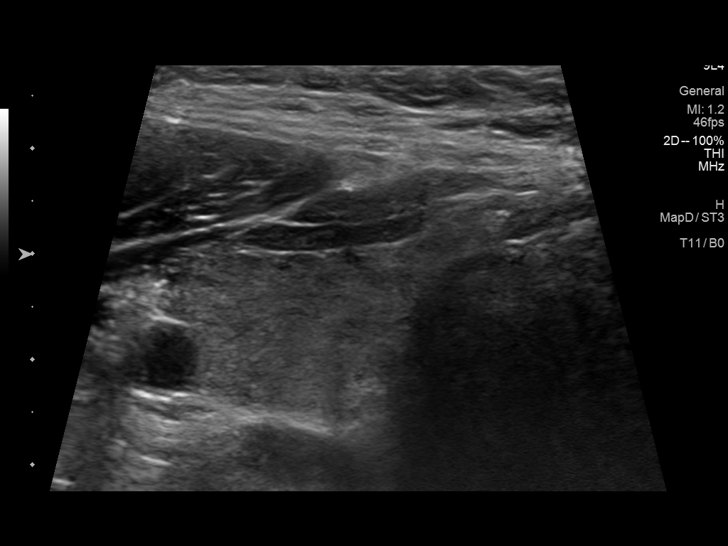
[im 38/42]
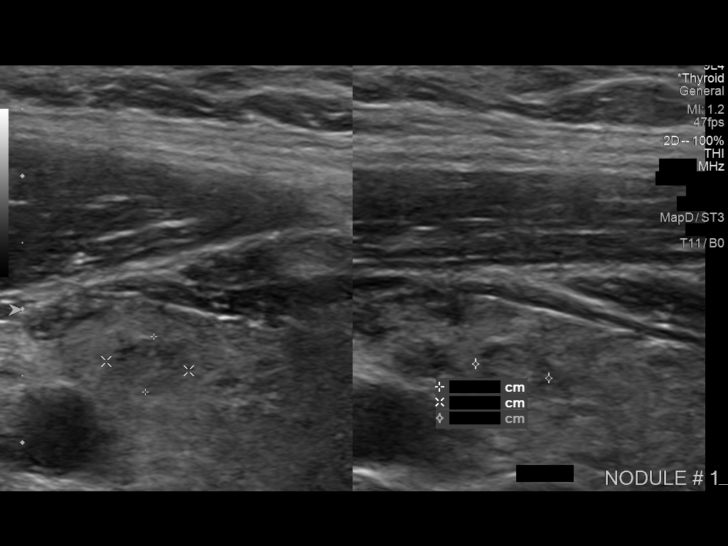
[im 42/42]
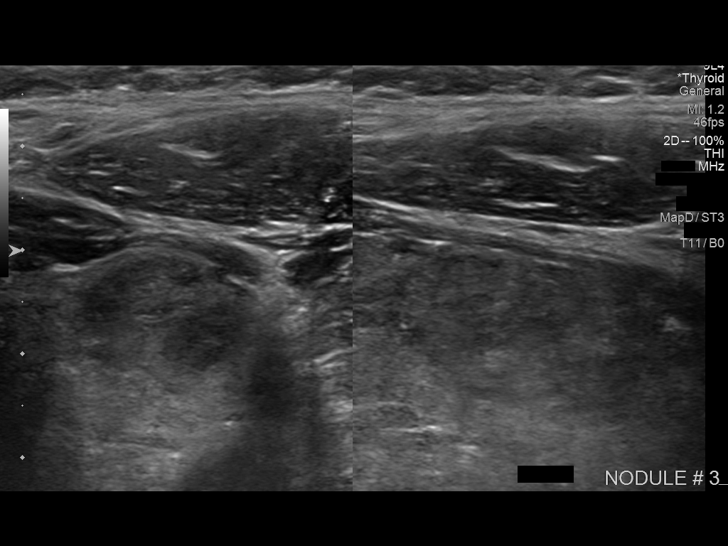

[13 of 25 positions shown; findings below may reference images not displayed]

FINDINGS: Parenchymal Echotexture: Moderately heterogenous

Isthmus: 0.3 cm

Right lobe: 4.5 x 1 2 x 3.1 cm

Left lobe: 4.3 x 1.3 x 2.2 cm

_________________________________________________________

Estimated total number of nodules >/= 1 cm: 1

Number of spongiform nodules >/=  2 cm not described below (TR1): 0

Number of mixed cystic and solid nodules >/= 1.5 cm not described
below (TR2): 0

_________________________________________________________

Nodule # 3:

Location: Left; mid

Maximum size: 2.3 cm; Other 2 dimensions: 1.6 x 1.2 cm

Composition: solid/almost completely solid (2)

Echogenicity: hypoechoic (2)

Shape: not taller-than-wide (0)

Margins: ill-defined (0)

Echogenic foci: none (0)

ACR TI-RADS total points: 4.

ACR TI-RADS risk category: TR4 (4-6 points).

ACR TI-RADS recommendations:

**Given size (>/= 1.5 cm) and appearance, fine needle aspiration of
this moderately suspicious nodule should be considered based on
TI-RADS criteria.

_________________________________________________________

Remaining subcentimeter thyroid nodules do not meet criteria for FNA
or imaging follow-up.
IMPRESSION: Nodule 3 (TI-RADS 4), measuring 2.3 x 1.6 x 1.2 cm, located in the
mid left thyroid lobe meets criteria for FNA.

The above is in keeping with the ACR TI-RADS recommendations - [HOSPITAL] 6765;[DATE].

## 2023-05-25 IMAGING — US US EXTREM LOW DUPLEX ARTERIAL BILAT
2 series · 14 of 25 positions shown · non-contrast
Comparison: None.

CLINICAL DATA: 50-year-old female with a history of claudication

EXAM:
NONINVASIVE PHYSIOLOGIC VASCULAR STUDY OF BILATERAL LOWER
EXTREMITIES
TECHNIQUE: Evaluation of both lower extremities was performed at rest,
including calculation of ankle-brachial indices, multiple segmental
exam, and directed duplex.

[Series 1: us arterial lower extremity duplex bilateral (non- · arterial · 13 of 159 slices shown]
[im 1/159]
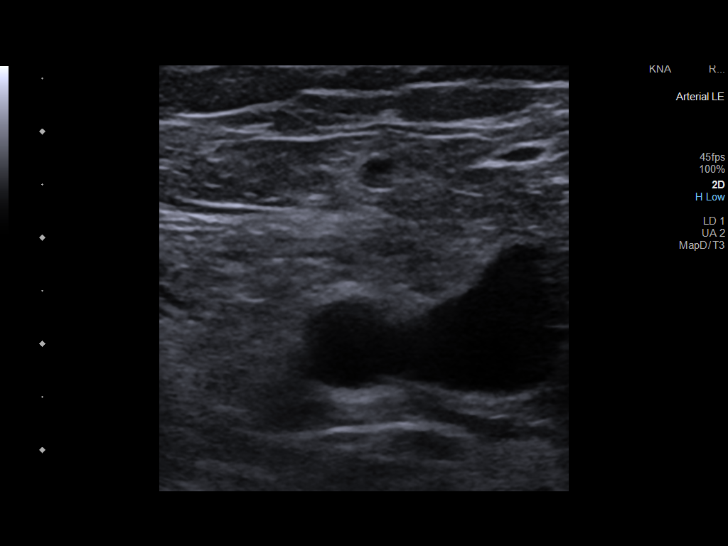
[im 14/159]
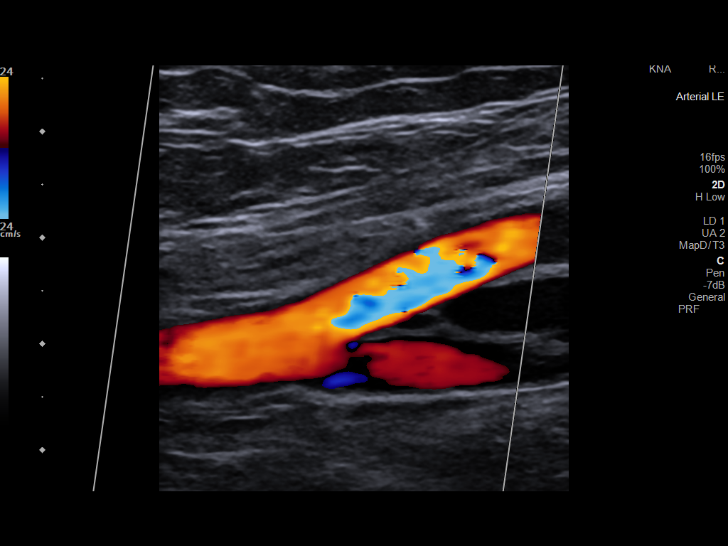
[im 28/159]
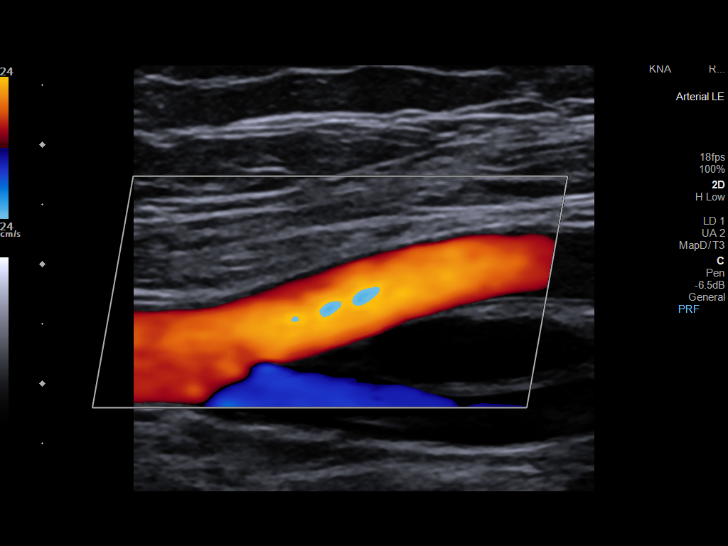
[im 42/159]
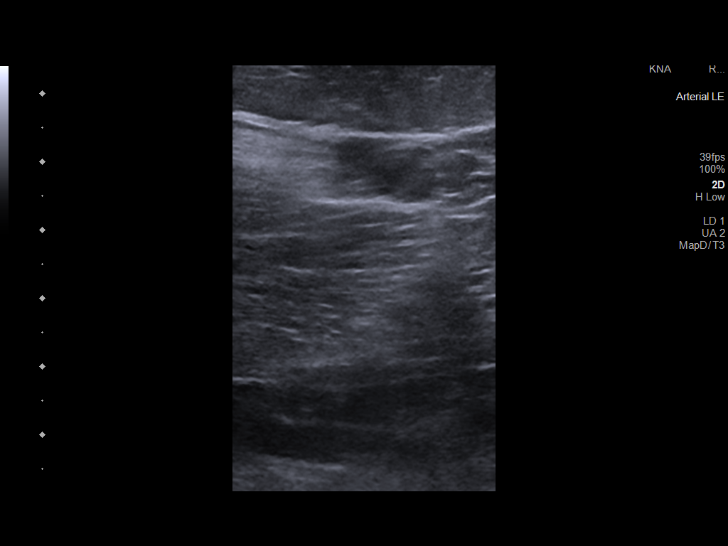
[im 55/159]
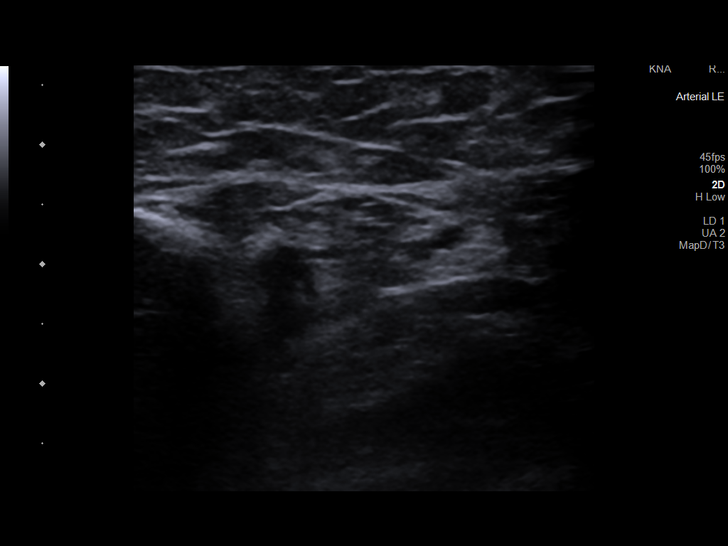
[im 62/159]
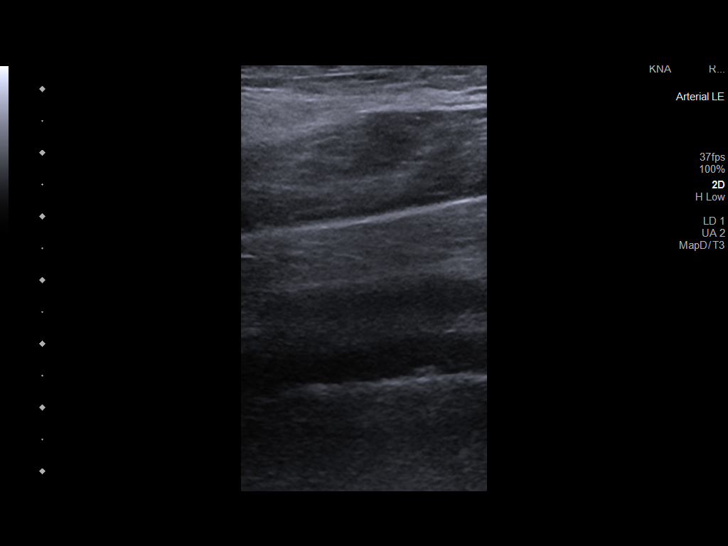
[im 76/159]
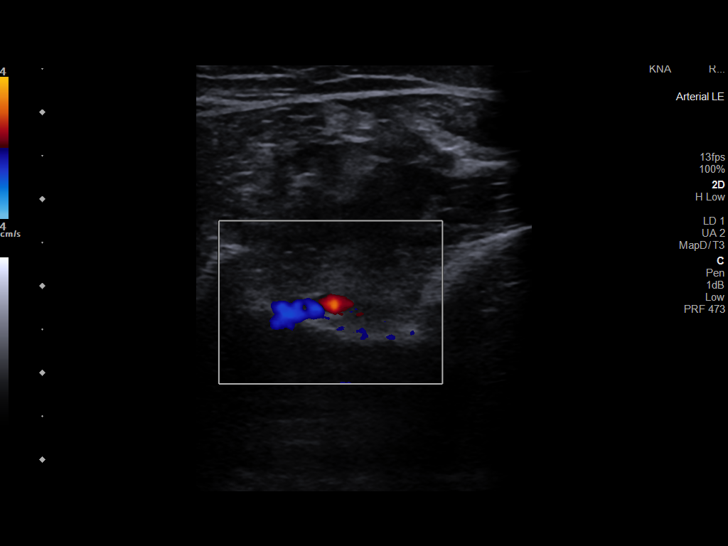
[im 90/159]
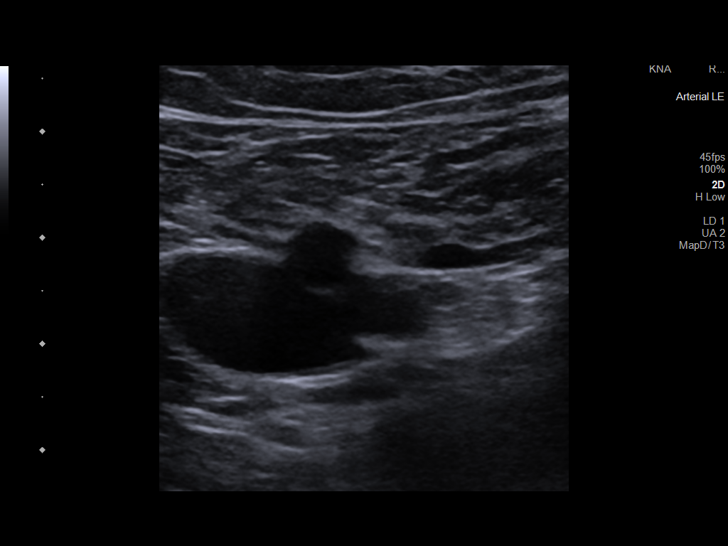
[im 104/159]
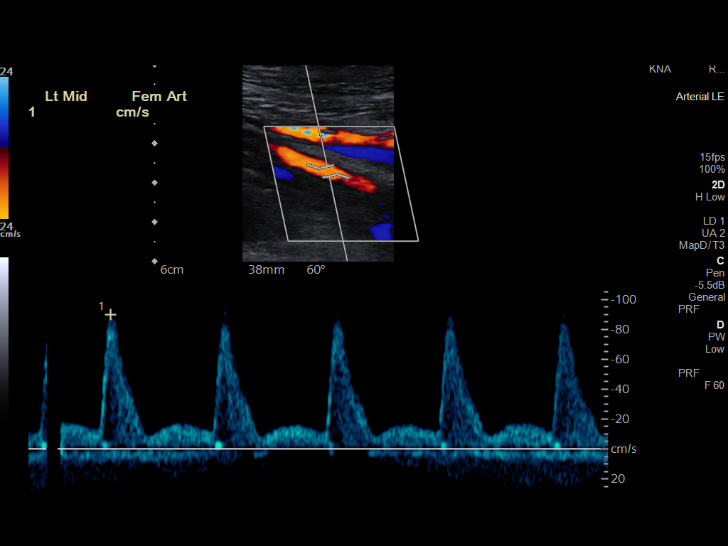
[im 110/159]
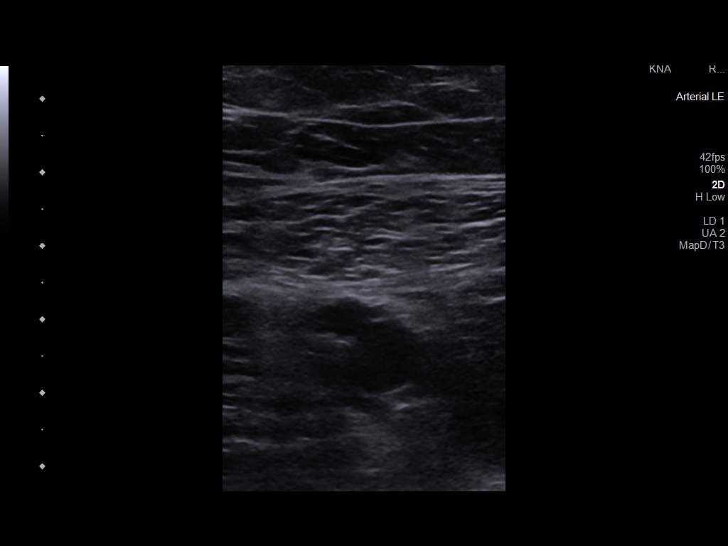
[im 124/159]
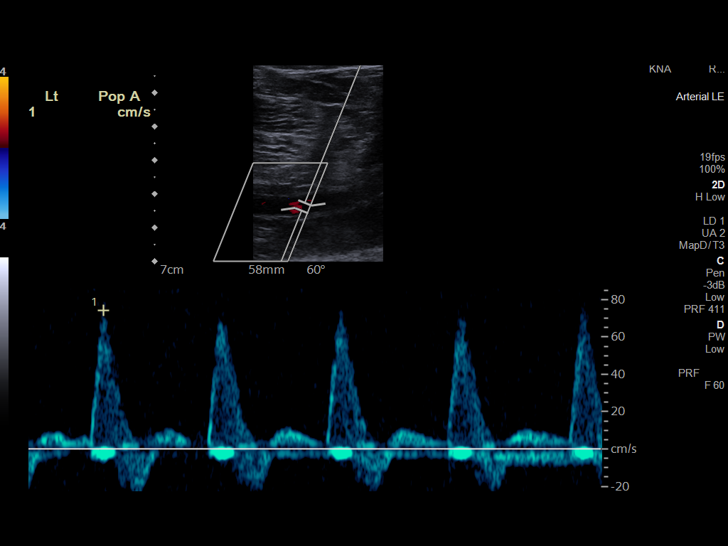
[im 138/159]
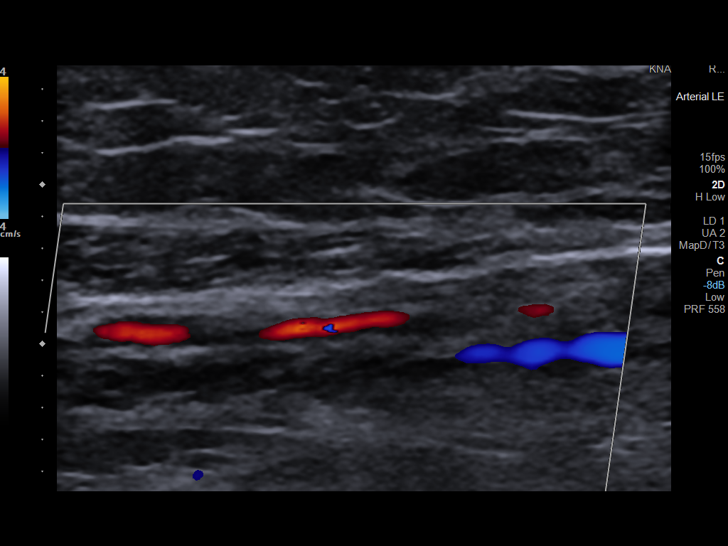
[im 152/159]
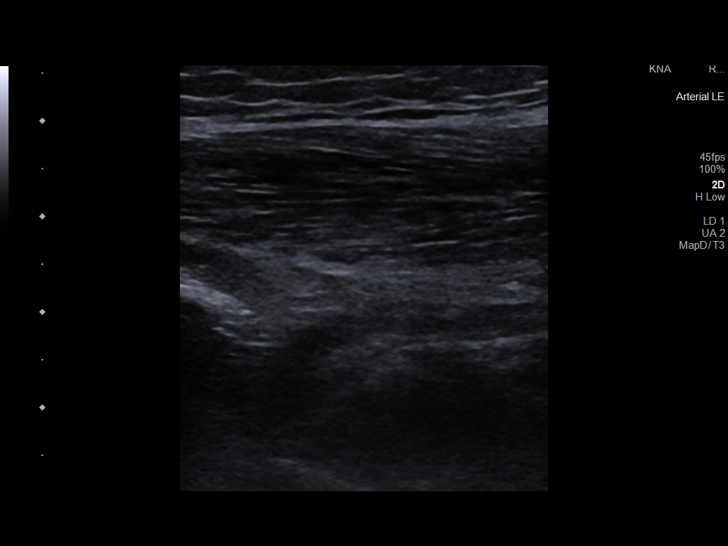

[Series 1001: arterial le · arterial · 1 of 1 slices shown]
[im 1/1]
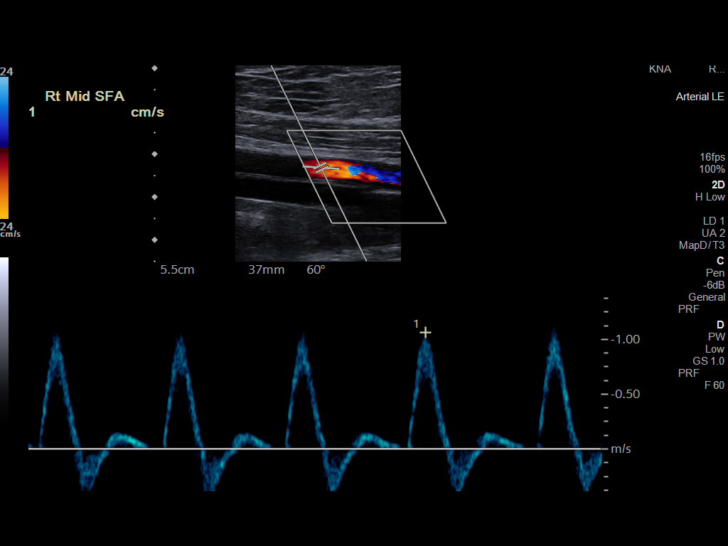

[14 of 25 positions shown; findings below may reference images not displayed]

FINDINGS: Right ABI:

Left ABI:

Right Lower Extremity:

Segmental Doppler of the right ankle demonstrates multiphasic
waveforms.

Directed duplex of the right lower extremity deep demonstrates
triphasic waveforms throughout.

Left Lower Extremity:

Segmental Doppler of the left ankle demonstrates multiphasic
waveforms.

Directed duplex of the left lower extremity demonstrates triphasic
waveforms throughout
IMPRESSION: Resting ABI the bilateral lower extremities within normal limits,
with the noninvasive study demonstrating no significant arterial
occlusive disease.

## 2023-06-21 IMAGING — US US FNA BIOPSY THYROID 1ST LESION
1 series · 12 of 12 positions shown · non-contrast
Comparison: US Thyroid 10/17/21

MEDICATIONS:
5 cc 1% lidocaine

COMPLICATIONS:
None immediate.

INDICATION: Left mid lobe thyroid nodule

2.3 cm
EXAM:
ULTRASOUND GUIDED FINE NEEDLE ASPIRATION OF INDETERMINATE THYROID
NODULE
TECHNIQUE: Informed written consent was obtained from the patient after a
discussion of the risks, benefits and alternatives to treatment.
Questions regarding the procedure were encouraged and answered. A
timeout was performed prior to the initiation of the procedure.

[Series 1: us fna biopsy thyroid 1st lesion · 0.07mm/px · 12 acquisitions, 12 frames shown]
[im 1/12]
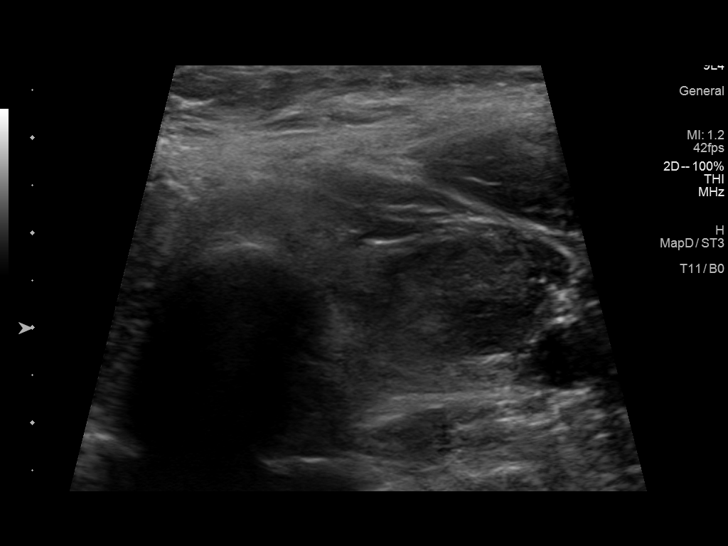
[im 2/12]
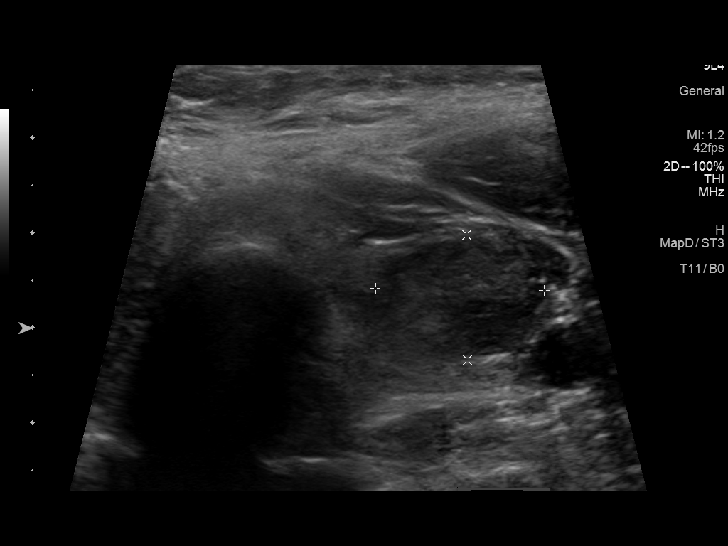
[im 3/12]
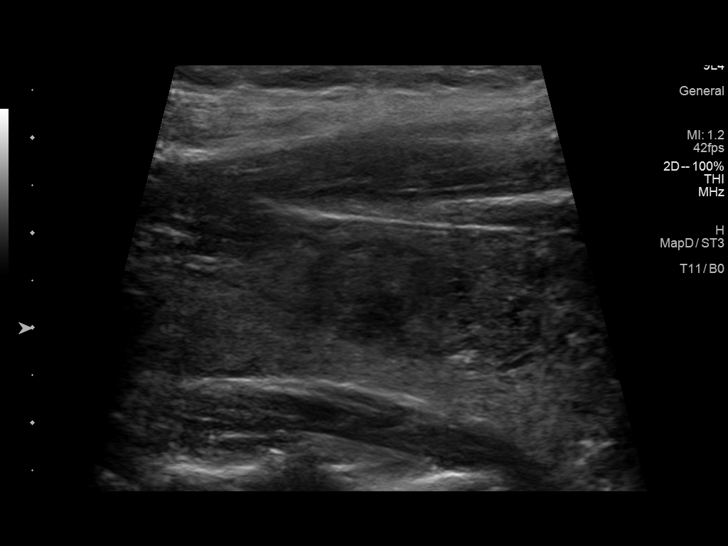
[im 4/12]
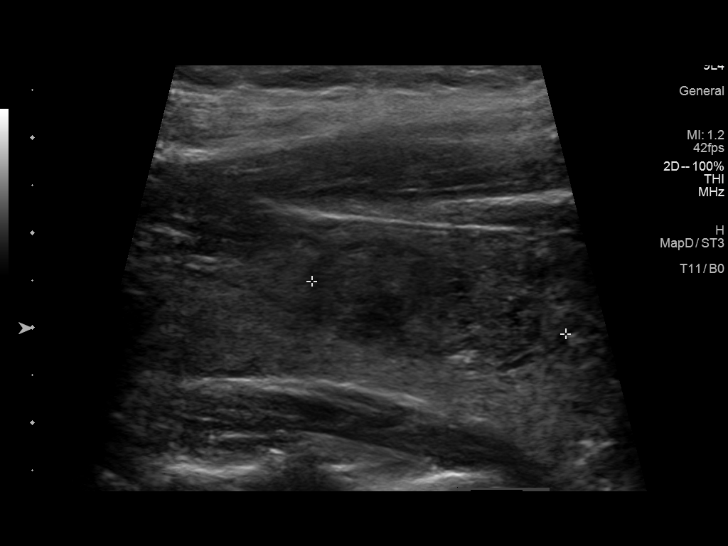
[im 5/12]
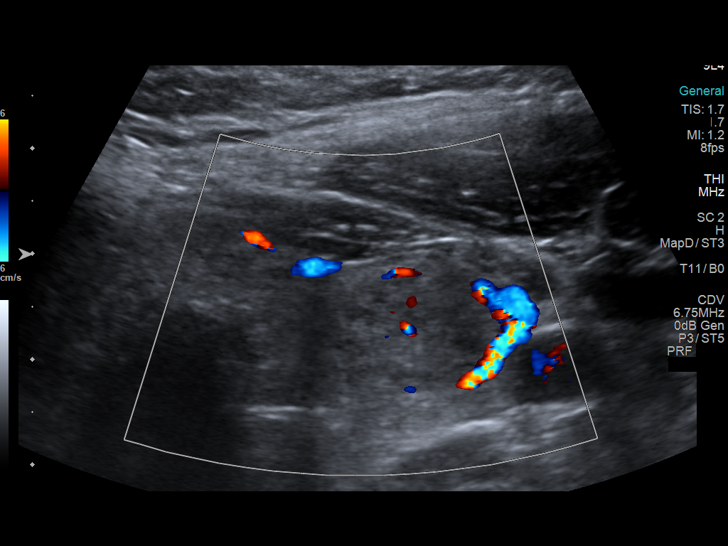
[im 6/12]
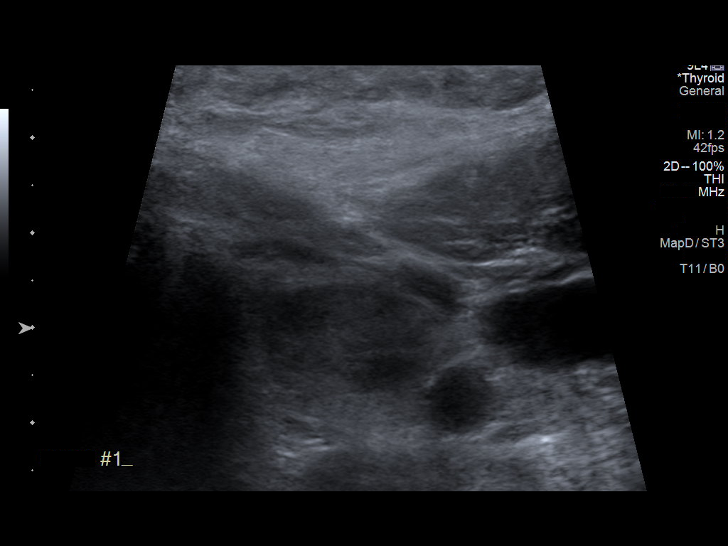
[im 7/12]
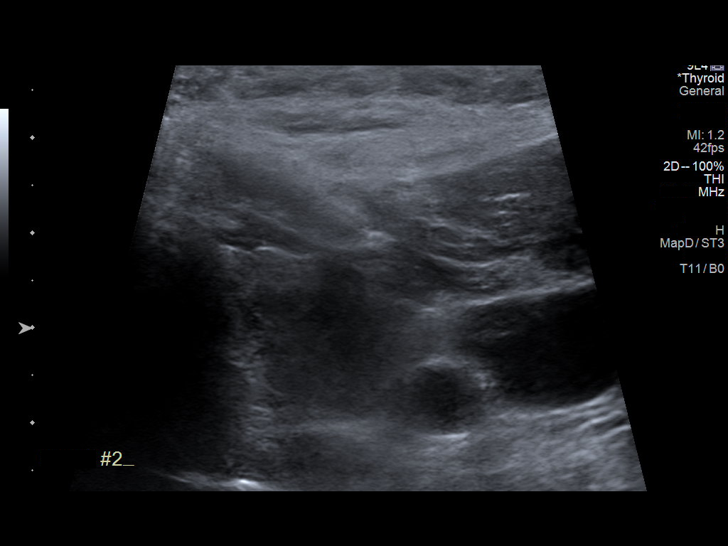
[im 8/12]
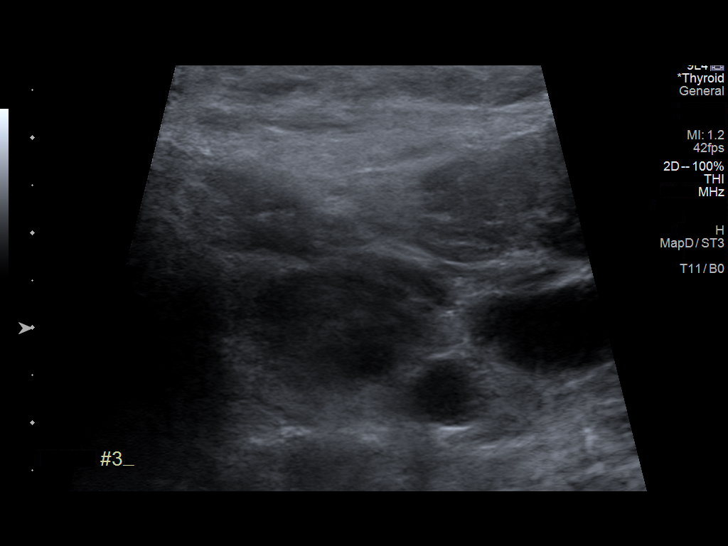
[im 9/12]
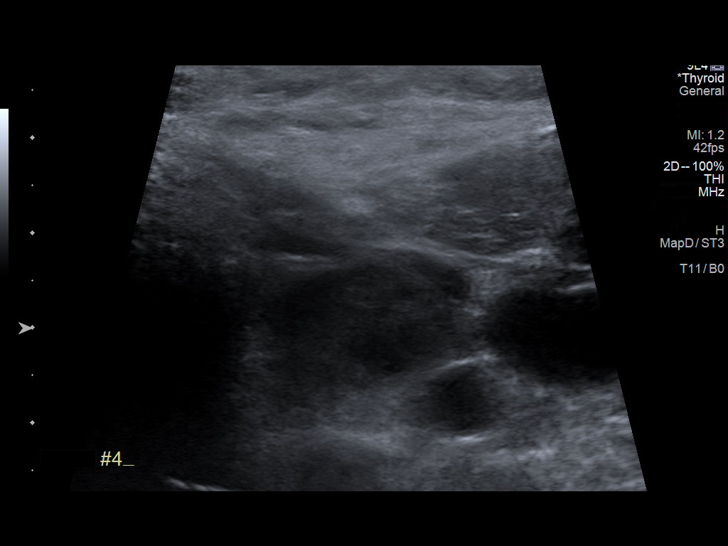
[im 10/12]
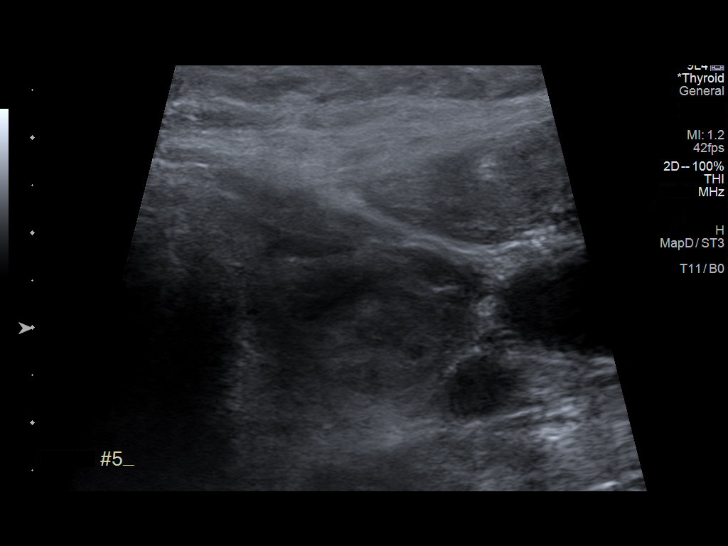
[im 11/12]
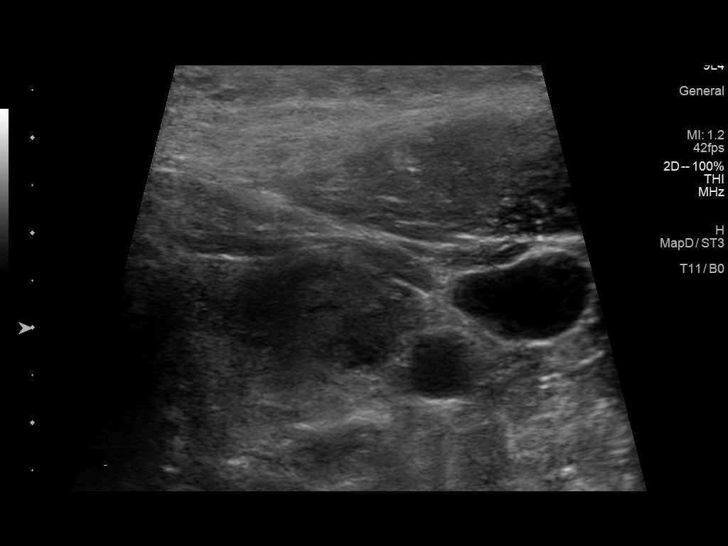
[im 12/12]
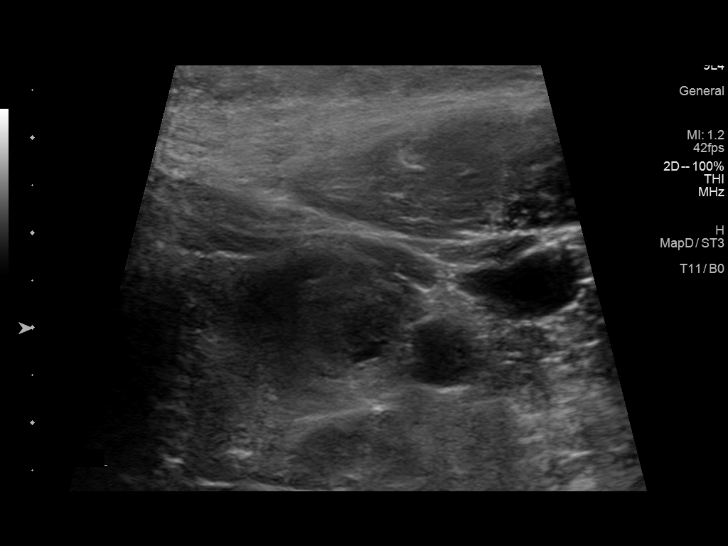

[12 of 12 positions shown; findings below may reference images not displayed]

Pre-procedural ultrasound scanning demonstrated unchanged size and
appearance of the indeterminate nodule within the left thyroid

The procedure was planned. The neck was prepped in the usual sterile
fashion, and a sterile drape was applied covering the operative
field. A timeout was performed prior to the initiation of the
procedure. Local anesthesia was provided with 1% lidocaine.

Under direct ultrasound guidance, 5 FNA biopsies were performed of
the left mid lobe thyroid nodule with a 27 gauge needle.

2 of these samples were obtained for AFIRMA

Multiple ultrasound images were saved for procedural documentation
purposes. The samples were prepared and submitted to pathology.

Limited post procedural scanning was negative for hematoma or
additional complication. Dressings were placed. The patient
tolerated the above procedures procedure well without immediate
postprocedural complication.
FINDINGS: Nodule reference number based on prior diagnostic ultrasound: 3

Maximum size: 2.3 cm

Location: Left; Mid

ACR TI-RADS risk category: TR4 (4-6 points)

Reason for biopsy: meets ACR TI-RADS criteria

Ultrasound imaging confirms appropriate placement of the needles
within the thyroid nodule.
IMPRESSION: Technically successful ultrasound guided fine needle aspiration of
left mid lobe thyroid nodule

Read by

Pitufo Gulick

## 2023-07-08 NOTE — Progress Notes (Signed)
Name: Cassidy [name]  Age/ Sex: 52 y.o., female   MRN/ DOB: [id number], Feb 01, 1971     PCP: Tacy Learn, FNP   Reason for Endocrinology Evaluation: Hyperthyroidism/Type 2 Diabetes Mellitus  Initial Endocrine Consultative Visit: 12/05/2020    PATIENT IDENTIFIER: [name] is a 52 y.o. female with a past medical history of hyperthyroidism and T2DM. The patient has followed with Endocrinology clinic since 12/05/2020 for consultative assistance with management of her diabetes.     DIABETIC HISTORY:  Cassidy [name] was diagnosed with DM 04/2021, with an A1c 6.6%. She was started on Metfrmin through her PCP but was unable to tolerate.  In 06/2021 we switched  Metformin to Ozempic due to GI issues with Metformin   She had normal 24-hr urinary cortisol for weight gain 06/2021    THYROID HISTORY : She was found to have elevated Anti-TPO Ab at 147 IU/mL ( reference 0-34) and elevated Tg Ab's 642.9 IU/mL ( reference 0.0-0.9). Of note, the pt has normal total T3 at 143 ng/dL but low TSH at 4.09 uIU/mL  And normal FT4 at 1.09 ng/dL .   This was checked during routine check up    On her initial visit to our clinic she was noted to have a low TSH at 0.29 uIU/mL . Due to symptoms of fatigue, arthralgias, eye symptoms of discomfort and pruritic we opted to start methimazole.    TRAB is negative        She was started on Methimazole 12/2020   S/P left mid thyroid nodule 2.3 cm with benign cytology 11/2021     Mother had questionable thyromegaly , cousin with Grave's disease    Thyroid ultrasound showed questionable nodules    S/P hysterectomy      SUBJECTIVE:   During the last visit (01/01/2023): A1c 5.9%     Today (07/09/2023): Cassidy [name] is here for a follow up on Diabetes and hyperthyroidism.  She checks her blood sugars 1-2 twice weekly .   Weight stable  Denies nausea, vomiting Denies constipation  or diarrhea  Denies palpitations  No local neck symptoms    HOME ENDOCRINE REGIMEN:   Ozempic 0.5 mg weekly  Methimazole 5 mg , 1 tab three days a week ( Mon, Wed, Fri)  Atorvastatin 10 mg daily    Statin: no ACE-I/ARB: yes    METER DOWNLOAD later in the day: Did not bring    DIABETIC COMPLICATIONS: Microvascular complications:   Denies: neuropathy , retinopathy  Last Eye Exam: Completed 11/2022  Macrovascular complications:   Denies: CAD, CVA, PVD   HISTORY:  Past Medical History:  Past Medical History:  Diagnosis Date   ANEMIA-IRON DEFICIENCY 09/20/2007   ASTHMA 09/20/2007   COMMON MIGRAINE 01/02/2009   FEVER UNSPECIFIED 01/17/2008   FROZEN RIGHT SHOULDER 07/10/2009   GERD 01/02/2009   GLUCOSE INTOLERANCE 01/02/2009   Headache(784.0) 09/20/2007   Heart palpitations    Hx: of with low potassium levels   Hematuria 07/20/2011   HYPERTENSION 01/02/2009   Impaired glucose tolerance 07/17/2011   LATERAL EPICONDYLITIS, RIGHT 06/05/2009   Other and unspecified hyperlipidemia 01/15/2013   PONV (postoperative nausea and vomiting)    ROTATOR CUFF SYNDROME, RIGHT 05/27/2009   SHOULDER PAIN, RIGHT 05/27/2009   Past Surgical History:  Past Surgical History:  Procedure Laterality Date   ABDOMINAL HYSTERECTOMY     BIOPSY  07/03/2020   Procedure: BIOPSY;  Surgeon: Malissa Hippo, MD;  Location: [facility];  Service: Endoscopy;;   COLONOSCOPY WITH  PROPOFOL N/A 07/03/2020   Procedure: COLONOSCOPY WITH PROPOFOL;  Surgeon: Malissa Hippo, MD;  Location: [facility];  Service: Endoscopy;  Laterality: N/A;  730   ESOPHAGOGASTRODUODENOSCOPY (EGD) WITH PROPOFOL N/A 07/03/2020   Procedure: ESOPHAGOGASTRODUODENOSCOPY (EGD) WITH PROPOFOL;  Surgeon: Malissa Hippo, MD;  Location: [facility];  Service: Endoscopy;  Laterality: N/A;   FRACTURE SURGERY     Hx: of left ankle   HARDWARE REMOVAL Left 03/10/2013   Procedure: HARDWARE REMOVAL;  Surgeon: Nadara Mustard, MD;  Location: [facility];  Service: Orthopedics;  Laterality: Left;  Left Ankle Removal Medial Malleolus Screws    HYSTEROTOMY  2017   POLYPECTOMY  07/03/2020   Procedure: POLYPECTOMY;  Surgeon: Malissa Hippo, MD;  Location: [facility];  Service: Endoscopy;;   s/p epicondylar release     s/p LEEP procedure 2000 for abnormal pap     Normal since then   s/p uterine ablation  2007   TUBAL LIGATION  1955   Social History:  reports that she has never smoked. She has never used smokeless tobacco. She reports that she does not drink alcohol and does not use drugs. Family History:  Family History  Problem Relation Age of Onset   Hypertension Father    Hyperlipidemia Father    Diabetes Father    Colon cancer Paternal Grandfather    Diabetes Mother      HOME MEDICATIONS: Allergies as of 07/09/2023       Reactions   Omeprazole    Augmentin [amoxicillin-pot Clavulanate]    Cleocin [clindamycin Hcl] Diarrhea, Nausea And Vomiting   Frovatriptan Other (See Comments)   parasthesia   Gabapentin (once-daily)    Montelukast Sodium Other (See Comments)   parasthesia   Morphine And Codeine Nausea And Vomiting   And dizzy, and "feeling terrible"   Propranolol Hcl    unknown   Singulair [montelukast]    Sumatriptan Other (See Comments)   Masked asthma   Vancomycin Other (See Comments)   Had reaction during recovery, maybe hives or rash   Benzoin Rash   And blisters        Medication List        Accurate as of July 09, 2023  7:42 AM. If you have any questions, ask your nurse or doctor.          STOP taking these medications    Semaglutide(0.25 or 0.5MG /DOS) 2 MG/3ML Sopn Replaced by: Semaglutide (1 MG/DOSE) 4 MG/3ML Sopn Stopped by: Johnney Ou Aubrie Lucien   vitamin C 1000 MG tablet Stopped by: Johnney Ou Elisandra Deshmukh       TAKE these medications    albuterol 108 (90 Base) MCG/ACT inhaler Commonly known as: VENTOLIN HFA Inhale 2 puffs into the lungs every 6 (six) hours as needed for wheezing.   APPLE CIDER VINEGAR DIET PO Take 1 capsule by mouth daily.   atorvastatin 10 MG  tablet Commonly known as: LIPITOR Take 1 tablet (10 mg total) by mouth daily.   cetirizine 10 MG tablet Commonly known as: ZYRTEC Take 10 mg by mouth daily.   dexlansoprazole 60 MG capsule Commonly known as: Dexilant Take 1 capsule (60 mg total) by mouth daily.   fexofenadine 180 MG tablet Commonly known as: ALLEGRA Take 1 tablet (180 mg total) by mouth every evening. What changed:  when to take this reasons to take this   hydrOXYzine 25 MG tablet Commonly known as: ATARAX Take 25 mg by mouth 3 (three) times daily as needed  for itching. As needed   losartan-hydrochlorothiazide 100-25 MG tablet Commonly known as: HYZAAR Take 1 tablet by mouth daily.   Melatonin 10 MG Tabs Take 10 mg by mouth every Monday, Tuesday, Wednesday, Thursday, and Friday.   methimazole 5 MG tablet Commonly known as: TAPAZOLE Take 1 tablet (5 mg total) by mouth as directed. 1 tablet 3 times a week   multivitamin with minerals Tabs tablet Take 1 tablet by mouth every evening.   pregabalin 100 MG capsule Commonly known as: LYRICA Take 100 mg by mouth 3 (three) times daily as needed.   rizatriptan 10 MG tablet Commonly known as: MAXALT Take 1 tablet (10 mg total) by mouth daily as needed for migraine. 1 tablet by mouth for Migraine.  May repeat in 2 hours (no more than 2 in 24 hours or 2-3 days a week).   Semaglutide (1 MG/DOSE) 4 MG/3ML Sopn Inject 1 mg as directed once a week. Replaces: Semaglutide(0.25 or 0.5MG /DOS) 2 MG/3ML Sopn Started by: Johnney Ou Kalli Greenfield   Vitamin D 125 MCG (5000 UT) Caps Take 5,000 Units by mouth daily.         OBJECTIVE:   Vital Signs: BP 122/80 (BP Location: Left Arm, Patient Position: Sitting, Cuff Size: Large)   Pulse 74   Ht 5\' 7"  (1.702 m)   Wt 270 lb (122.5 kg)   LMP 02/06/2013   SpO2 99%   BMI 42.29 kg/m   Wt Readings from Last 3 Encounters:  07/09/23 270 lb (122.5 kg)  01/01/23 272 lb (123.4 kg)  06/30/22 274 lb 12.8 oz (124.6 kg)      Exam: General: Pt appears well and is in NAD  Neck: General: Supple without adenopathy. Thyroid: Thyroid size normal.  No goiter or nodules appreciated.   Lungs: Clear with good BS bilat with no rales, rhonchi, or wheezes  Heart: RRR  Abdomen: soft, nontender  Extremities: No pretibial edema.  Neuro: MS is good with appropriate affect, pt is alert and Ox3      DM Foot Exam 07/09/2023 The skin of the feet is intact without sores or ulcerations. The pedal pulses are 2+ on right and 2+ on left. The sensation is intact to a screening 5.07, 10 gram monofilament bilaterally     DATA REVIEWED:  Lab Results  Component Value Date   HGBA1C 5.9 (A) 07/09/2023   HGBA1C 5.9 (A) 01/01/2023   HGBA1C 5.7 (A) 06/30/2022    Latest Reference Range & Units 07/09/23 07:48  TSH 0.35 - 5.50 uIU/mL 0.40  T4,Free(Direct) 0.60 - 1.60 ng/dL 1.61      Latest Reference Range & Units 01/01/23 08:08  Sodium 135 - 145 mEq/L 139  Potassium 3.5 - 5.1 mEq/L 3.6  Chloride 96 - 112 mEq/L 100  CO2 19 - 32 mEq/L 31  Glucose 70 - 99 mg/dL 096 (H)  BUN 6 - 23 mg/dL 16  Creatinine 0.45 - 4.09 mg/dL 8.11  Calcium 8.4 - 91.4 mg/dL 9.9  Alkaline Phosphatase 39 - 117 U/L 73  Albumin 3.5 - 5.2 g/dL 3.9  AST 0 - 37 U/L 22  ALT 0 - 35 U/L 35  Total Protein 6.0 - 8.3 g/dL 7.4  Total Bilirubin 0.2 - 1.2 mg/dL 0.3  GFR >78.29 mL/min 74.03    Latest Reference Range & Units 01/01/23 08:08  Total CHOL/HDL Ratio  4  Cholesterol 0 - 200 mg/dL 562  HDL Cholesterol >13.08 mg/dL 65.78  LDL (calc) 0 - 99 mg/dL 469 (H)  NonHDL  128.63  Triglycerides 0.0 - 149.0 mg/dL 161.0  VLDL 0.0 - 96.0 mg/dL 45.4     Latest Reference Range & Units 01/01/23 08:08  TSH 0.35 - 5.50 uIU/mL 1.03  T4,Free(Direct) 0.60 - 1.60 ng/dL 0.98    Thyroid ultrasound 10/17/2021  Parenchymal Echotexture: Moderately heterogenous   Isthmus: 0.3 cm   Right lobe: 4.5 x 1 2 x 3.1 cm   Left lobe: 4.3 x 1.3 x 2.2 cm    _________________________________________________________   Estimated total number of nodules >/= 1 cm: 1   Number of spongiform nodules >/=  2 cm not described below (TR1): 0   Number of mixed cystic and solid nodules >/= 1.5 cm not described below (TR2): 0   _________________________________________________________   Nodule # 3:   Location: Left; mid   Maximum size: 2.3 cm; Other 2 dimensions: 1.6 x 1.2 cm   Composition: solid/almost completely solid (2)   Echogenicity: hypoechoic (2)   Shape: not taller-than-wide (0)   Margins: ill-defined (0)   Echogenic foci: none (0)   ACR TI-RADS total points: 4.   ACR TI-RADS risk category: TR4 (4-6 points).   ACR TI-RADS recommendations:   **Given size (>/= 1.5 cm) and appearance, fine needle aspiration of this moderately suspicious nodule should be considered based on TI-RADS criteria.   _________________________________________________________   Remaining subcentimeter thyroid nodules do not meet criteria for FNA or imaging follow-up.   IMPRESSION: Nodule 3 (TI-RADS 4), measuring 2.3 x 1.6 x 1.2 cm, located in the mid left thyroid lobe meets criteria for FNA.   Left nodule FNA 11/13/2021    Clinical History: Left mid, 2.3 cm; Other 2 dimensions: 1.6 x 1.2 cm,  Solid/almost completely solid, Hypoechoic, TI-RADS total points: 4  Specimen Submitted:  A. THYROID, LEFT MID NODULE # 3, FINE NEEDLE  ASPIRATION:    FINAL MICROSCOPIC DIAGNOSIS:  - Consistent with benign follicular nodule (Bethes[employer] category II)  ASSESSMENT / PLAN / RECOMMENDATIONS:   1) Type 2 Diabetes Mellitus, Optimally controlled, Without complications - Most recent A1c of 5.9 %. Goal A1c < 7.0 %.    -A1c 5.9% , which is stable from prior but given lack of weight loss and pt has been eating candy corn , we opted to increase Ozempic  -   MEDICATIONS: Increase Ozempic 1 mg weekly  EDUCATION / INSTRUCTIONS: BG monitoring instructions: Patient is  instructed to check her blood sugars 2 times a week.    2) Diabetic complications:  Eye: Does not have known diabetic retinopathy.  Neuro/ Feet: Does not have known diabetic peripheral neuropathy .  Renal: Patient does not have known baseline CKD. She   is  on an ACEI/ARB at present.   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 -She has elevated anti-TPO antibody, but TRAB levels undetectable -She is clinically euthyroid -Repeat TFTs today remain normal     Medications     Continue  methimazole 5 mg, 1 tablet 3 days a week    4) Dyslipidemia    -LDL has trended up in the past  - Has been eating candy corn, we opted to check today   -Encouraged low-fat diet  Medication  Continue Atorvastatin 10 mg daily  5) Multinodular Goiter:  -No local neck symptoms -She is  S/P left mid thyroid nodule 2.3 cm with benign cytology 11/2021 -Scheduled for thyroid ultrasound 07/16/2023      F/U in 6 months     Signed electronically by: Lyndle Herrlich, MD  [facility]  [facility] [address] [place], [place] [address] Phone: [phone] FAX: [phone]   CC: Tacy Learn, FNP [address] [place] [address] Phone: [phone]  Fax: [phone]  Return to Endocrinology clinic as below: Future Appointments  Date Time Provider Department Center  07/16/2023  8:00 AM [id number] [place] [id number] [address] [facility]

## 2023-07-09 ENCOUNTER — Encounter: Payer: Self-pay | Admitting: Internal Medicine

## 2023-07-09 ENCOUNTER — Ambulatory Visit (INDEPENDENT_AMBULATORY_CARE_PROVIDER_SITE_OTHER): Payer: BC Managed Care – PPO | Admitting: Internal Medicine

## 2023-07-09 VITALS — BP 122/80 | HR 74 | Ht 67.0 in | Wt 270.0 lb

## 2023-07-09 DIAGNOSIS — Z7985 Long-term (current) use of injectable non-insulin antidiabetic drugs: Secondary | ICD-10-CM | POA: Insufficient documentation

## 2023-07-09 DIAGNOSIS — E785 Hyperlipidemia, unspecified: Secondary | ICD-10-CM

## 2023-07-09 DIAGNOSIS — E119 Type 2 diabetes mellitus without complications: Secondary | ICD-10-CM | POA: Diagnosis not present

## 2023-07-09 DIAGNOSIS — E0590 Thyrotoxicosis, unspecified without thyrotoxic crisis or storm: Secondary | ICD-10-CM

## 2023-07-09 DIAGNOSIS — E042 Nontoxic multinodular goiter: Secondary | ICD-10-CM

## 2023-07-09 LAB — POCT GLUCOSE (DEVICE FOR HOME USE): POC Glucose: 100 mg/dL — AB (ref 70–99)

## 2023-07-09 LAB — POCT GLYCOSYLATED HEMOGLOBIN (HGB A1C): Hemoglobin A1C: 5.9 % — AB (ref 4.0–5.6)

## 2023-07-09 LAB — TSH: TSH: 0.4 u[IU]/mL (ref 0.35–5.50)

## 2023-07-09 LAB — T4, FREE: Free T4: 0.84 ng/dL (ref 0.60–1.60)

## 2023-07-09 MED ORDER — SEMAGLUTIDE (1 MG/DOSE) 4 MG/3ML ~~LOC~~ SOPN: 1.0000 mg | PEN_INJECTOR | SUBCUTANEOUS | 3 refills | Status: DC

## 2023-07-09 MED ORDER — METHIMAZOLE 5 MG PO TABS: 5.0000 mg | ORAL_TABLET | ORAL | 3 refills | Status: DC

## 2023-07-09 NOTE — Patient Instructions (Signed)
Increase Ozempic 1 mg weekly  

## 2023-07-16 ENCOUNTER — Ambulatory Visit
Admission: RE | Admit: 2023-07-16 | Discharge: 2023-07-16 | Disposition: A | Discharge status: Home / Self Care | Payer: BC Managed Care – PPO | Source: Ambulatory Visit | Attending: Internal Medicine | Admitting: Internal Medicine

## 2023-07-16 DIAGNOSIS — E042 Nontoxic multinodular goiter: Secondary | ICD-10-CM

## 2023-09-07 ENCOUNTER — Other Ambulatory Visit: Payer: Self-pay | Admitting: Medical Genetics

## 2023-09-07 DIAGNOSIS — Z006 Encounter for examination for normal comparison and control in clinical research program: Secondary | ICD-10-CM

## 2023-09-09 ENCOUNTER — Ambulatory Visit (INDEPENDENT_AMBULATORY_CARE_PROVIDER_SITE_OTHER): Payer: BC Managed Care – PPO | Admitting: Gastroenterology

## 2023-10-01 ENCOUNTER — Other Ambulatory Visit (HOSPITAL_COMMUNITY): Payer: Self-pay | Attending: Medical Genetics

## 2023-12-08 ENCOUNTER — Other Ambulatory Visit: Payer: Self-pay

## 2023-12-08 MED ORDER — ATORVASTATIN CALCIUM 10 MG PO TABS: 10.0000 mg | ORAL_TABLET | Freq: Every day | ORAL | 3 refills | Status: AC

## 2024-01-10 ENCOUNTER — Encounter: Payer: Self-pay | Admitting: Internal Medicine

## 2024-01-10 ENCOUNTER — Ambulatory Visit (INDEPENDENT_AMBULATORY_CARE_PROVIDER_SITE_OTHER): Payer: BC Managed Care – PPO | Admitting: Internal Medicine

## 2024-01-10 VITALS — BP 122/80 | HR 84 | Ht 67.0 in | Wt 275.0 lb

## 2024-01-10 DIAGNOSIS — E785 Hyperlipidemia, unspecified: Secondary | ICD-10-CM | POA: Diagnosis not present

## 2024-01-10 DIAGNOSIS — E042 Nontoxic multinodular goiter: Secondary | ICD-10-CM | POA: Diagnosis not present

## 2024-01-10 DIAGNOSIS — E0590 Thyrotoxicosis, unspecified without thyrotoxic crisis or storm: Secondary | ICD-10-CM

## 2024-01-10 DIAGNOSIS — E119 Type 2 diabetes mellitus without complications: Secondary | ICD-10-CM

## 2024-01-10 DIAGNOSIS — Z7985 Long-term (current) use of injectable non-insulin antidiabetic drugs: Secondary | ICD-10-CM | POA: Diagnosis not present

## 2024-01-10 LAB — POCT GLYCOSYLATED HEMOGLOBIN (HGB A1C): Hemoglobin A1C: 5.8 % — AB (ref 4.0–5.6)

## 2024-01-10 LAB — POCT GLUCOSE (DEVICE FOR HOME USE): Glucose Fasting, POC: 107 mg/dL — AB (ref 70–99)

## 2024-01-10 MED ORDER — SEMAGLUTIDE (2 MG/DOSE) 8 MG/3ML ~~LOC~~ SOPN
2.0000 mg | PEN_INJECTOR | SUBCUTANEOUS | 3 refills | Status: AC
Start: 2024-01-10 — End: ?

## 2024-01-10 MED ORDER — ACCU-CHEK GUIDE W/DEVICE KIT: 1.0000 | PACK | Freq: Every day | 0 refills | Status: AC

## 2024-01-10 MED ORDER — ACCU-CHEK GUIDE TEST VI STRP: 1.0000 | ORAL_STRIP | Freq: Every day | 12 refills | Status: AC

## 2024-01-10 NOTE — Progress Notes (Unsigned)
 Name: Cassidy [name]  Age/ Sex: 53 y.o., female   MRN/ DOB: [id number], 17-Oct-1971     PCP: Tacy Learn, FNP   Reason for Endocrinology Evaluation: Hyperthyroidism/Type 2 Diabetes Mellitus  Initial Endocrine Consultative Visit: 12/05/2020    PATIENT IDENTIFIER: Cassidy [name] is a 53 y.o. female with a past medical history of hyperthyroidism and T2DM. The patient has followed with Endocrinology clinic since 12/05/2020 for consultative assistance with management of her diabetes.     DIABETIC HISTORY:  Ms. Biondo was diagnosed with DM 04/2021, with an A1c 6.6%. She was started on Metfrmin through her PCP but was unable to tolerate.  In 06/2021 we switched  Metformin to Ozempic due to GI issues with Metformin   She had normal 24-hr urinary cortisol for weight gain 06/2021    THYROID HISTORY : She was found to have elevated Anti-TPO Ab at 147 IU/mL ( reference 0-34) and elevated Tg Ab's 642.9 IU/mL ( reference 0.0-0.9). Of note, the pt has normal total T3 at 143 ng/dL but low TSH at 9.51 uIU/mL  And normal FT4 at 1.09 ng/dL .   This was checked during routine check up    On her initial visit to our clinic she was noted to have a low TSH at 0.29 uIU/mL . Due to symptoms of fatigue, arthralgias, eye symptoms of discomfort and pruritic we opted to start methimazole.    TRAB is negative        She was started on Methimazole 12/2020   S/P left mid thyroid nodule 2.3 cm with benign cytology 11/2021     Mother had questionable thyromegaly , cousin with Grave's disease    Thyroid ultrasound showed questionable nodules    S/P hysterectomy      SUBJECTIVE:   During the last visit (07/09/2023): A1c 5.9%     Today (01/10/2024): [name] is here for a follow up on Diabetes and hyperthyroidism.  She checks her blood sugars 1-2 twice weekly .    Had an episode of nausea one time when she took Ozempic late in the day  but no vomiting  Has IBS with changes in chronic changes in bowel  movements  Denies palpitations  No local neck symptoms    HOME ENDOCRINE REGIMEN:  Ozempic 1 mg weekly  Methimazole 5 mg , 1 tab three days a week ( Mon, Wed, Fri)  Atorvastatin 10 mg daily     Statin: no ACE-I/ARB: yes    METER DOWNLOAD later in the day: Did not bring    DIABETIC COMPLICATIONS: Microvascular complications:   Denies: neuropathy , retinopathy  Last Eye Exam: Completed 11/2022  Macrovascular complications:   Denies: CAD, CVA, PVD   HISTORY:  Past Medical History:  Past Medical History:  Diagnosis Date   ANEMIA-IRON DEFICIENCY 09/20/2007   ASTHMA 09/20/2007   COMMON MIGRAINE 01/02/2009   FEVER UNSPECIFIED 01/17/2008   FROZEN RIGHT SHOULDER 07/10/2009   GERD 01/02/2009   GLUCOSE INTOLERANCE 01/02/2009   Headache(784.0) 09/20/2007   Heart palpitations    Hx: of with low potassium levels   Hematuria 07/20/2011   HYPERTENSION 01/02/2009   Impaired glucose tolerance 07/17/2011   LATERAL EPICONDYLITIS, RIGHT 06/05/2009   Other and unspecified hyperlipidemia 01/15/2013   PONV (postoperative nausea and vomiting)    ROTATOR CUFF SYNDROME, RIGHT 05/27/2009   SHOULDER PAIN, RIGHT 05/27/2009   Past Surgical History:  Past Surgical History:  Procedure Laterality Date   ABDOMINAL HYSTERECTOMY     BIOPSY  07/03/2020  Procedure: BIOPSY;  Surgeon: Malissa Hippo, MD;  Location: [facility];  Service: Endoscopy;;   COLONOSCOPY WITH PROPOFOL N/A 07/03/2020   Procedure: COLONOSCOPY WITH PROPOFOL;  Surgeon: Malissa Hippo, MD;  Location: [facility];  Service: Endoscopy;  Laterality: N/A;  730   ESOPHAGOGASTRODUODENOSCOPY (EGD) WITH PROPOFOL N/A 07/03/2020   Procedure: ESOPHAGOGASTRODUODENOSCOPY (EGD) WITH PROPOFOL;  Surgeon: Malissa Hippo, MD;  Location: [facility];  Service: Endoscopy;  Laterality: N/A;   FRACTURE SURGERY     Hx: of left ankle   HARDWARE REMOVAL Left 03/10/2013   Procedure: HARDWARE REMOVAL;  Surgeon: Nadara Mustard, MD;  Location: [facility];  Service:  Orthopedics;  Laterality: Left;  Left Ankle Removal Medial Malleolus Screws   HYSTEROTOMY  2017   POLYPECTOMY  07/03/2020   Procedure: POLYPECTOMY;  Surgeon: Malissa Hippo, MD;  Location: [facility];  Service: Endoscopy;;   s/p epicondylar release     s/p LEEP procedure 2000 for abnormal pap     Normal since then   s/p uterine ablation  2007   TUBAL LIGATION  1955   Social History:  reports that she has never smoked. She has never used smokeless tobacco. She reports that she does not drink alcohol and does not use drugs. Family History:  Family History  Problem Relation Age of Onset   Hypertension Father    Hyperlipidemia Father    Diabetes Father    Colon cancer Paternal Grandfather    Diabetes Mother      HOME MEDICATIONS: Allergies as of 01/10/2024       Reactions   Omeprazole    Augmentin [amoxicillin-pot Clavulanate]    Cleocin [clindamycin Hcl] Diarrhea, Nausea And Vomiting   Frovatriptan Other (See Comments)   parasthesia   Gabapentin (once-daily)    Montelukast Sodium Other (See Comments)   parasthesia   Morphine And Codeine Nausea And Vomiting   And dizzy, and "feeling terrible"   Propranolol Hcl    unknown   Singulair [montelukast]    Sumatriptan Other (See Comments)   Masked asthma   Vancomycin Other (See Comments)   Had reaction during recovery, maybe hives or rash   Benzoin Rash   And blisters        Medication List        Accurate as of January 10, 2024  7:35 AM. If you have any questions, ask your nurse or doctor.          albuterol 108 (90 Base) MCG/ACT inhaler Commonly known as: VENTOLIN HFA Inhale 2 puffs into the lungs every 6 (six) hours as needed for wheezing.   APPLE CIDER VINEGAR DIET PO Take 1 capsule by mouth daily.   atorvastatin 10 MG tablet Commonly known as: LIPITOR Take 1 tablet (10 mg total) by mouth daily.   cetirizine 10 MG tablet Commonly known as: ZYRTEC Take 10 mg by mouth daily.   clobetasol 0.05 %  external solution Commonly known as: TEMOVATE APPLY TO SCALP ONCE A DAY AS NEEDED FOR ITCHING   dexlansoprazole 60 MG capsule Commonly known as: Dexilant Take 1 capsule (60 mg total) by mouth daily.   estradiol 0.05 MG/24HR patch Commonly known as: VIVELLE-DOT Apply 1 patch twice a week by transdermal route.   fexofenadine 180 MG tablet Commonly known as: ALLEGRA Take 1 tablet (180 mg total) by mouth every evening. What changed:  when to take this reasons to take this   hydrOXYzine 25 MG tablet Commonly known as: ATARAX Take  25 mg by mouth 3 (three) times daily as needed for itching. As needed   ketoconazole 2 % shampoo Commonly known as: NIZORAL PLEASE SEE ATTACHED FOR DETAILED DIRECTIONS   levalbuterol 45 MCG/ACT inhaler Commonly known as: XOPENEX HFA PLEASE SEE ATTACHED FOR DETAILED DIRECTIONS   losartan-hydrochlorothiazide 100-25 MG tablet Commonly known as: HYZAAR Take 1 tablet by mouth daily.   Melatonin 10 MG Tabs Take 10 mg by mouth every Monday, Tuesday, Wednesday, Thursday, and Friday.   methimazole 5 MG tablet Commonly known as: TAPAZOLE Take 1 tablet (5 mg total) by mouth as directed. 1 tablet 3 times a week   multivitamin with minerals Tabs tablet Take 1 tablet by mouth every evening.   pregabalin 100 MG capsule Commonly known as: LYRICA Take 100 mg by mouth 3 (three) times daily as needed.   progesterone 100 MG capsule Commonly known as: PROMETRIUM Take 1 tablet by mouth daily.   rizatriptan 10 MG tablet Commonly known as: MAXALT Take 1 tablet (10 mg total) by mouth daily as needed for migraine. 1 tablet by mouth for Migraine.  May repeat in 2 hours (no more than 2 in 24 hours or 2-3 days a week).   Semaglutide (1 MG/DOSE) 4 MG/3ML Sopn Inject 1 mg as directed once a week.   sulfamethoxazole-trimethoprim 800-160 MG tablet Commonly known as: BACTRIM DS Take 1 tablet by mouth 2 (two) times daily.   Vitamin D 125 MCG (5000 UT) Caps Take  5,000 Units by mouth daily.         OBJECTIVE:   Vital Signs: BP 122/80 (BP Location: Left Arm, Patient Position: Sitting, Cuff Size: Normal)   Pulse 84   Ht 5\' 7"  (1.702 m)   Wt 275 lb (124.7 kg)   LMP 02/06/2013   SpO2 99%   BMI 43.07 kg/m   Wt Readings from Last 3 Encounters:  01/10/24 275 lb (124.7 kg)  07/09/23 270 lb (122.5 kg)  01/01/23 272 lb (123.4 kg)     Exam: General: Pt appears well and is in NAD  Neck: General: Supple without adenopathy. Thyroid: Thyroid size normal.  No goiter or nodules appreciated.   Lungs: Clear with good BS bilat   Heart: RRR  Extremities: No pretibial edema.  Neuro: MS is good with appropriate affect, pt is alert and Ox3      DM Foot Exam 07/09/2023 The skin of the feet is intact without sores or ulcerations. The pedal pulses are 2+ on right and 2+ on left. The sensation is intact to a screening 5.07, 10 gram monofilament bilaterally     DATA REVIEWED:  Lab Results  Component Value Date   HGBA1C 5.8 (A) 01/10/2024   HGBA1C 5.9 (A) 07/09/2023   HGBA1C 5.9 (A) 01/01/2023   ****   Thyroid ultrasound 07/16/2023   Estimated total number of nodules >/= 1 cm: 2   Number of spongiform nodules >/=  2 cm not described below (TR1): 0   Number of mixed cystic and solid nodules >/= 1.5 cm not described below (TR2): 0   _________________________________________________________   Nodule # 2: Hypoechoic solid nodule in the left upper gland measures 1.2 x 0.8 x 0.5 cm. This is slightly enlarged compared to a maximum of 0.8 cm previously. This lesion is consistent with TI-RADS category 4 and now meets criteria for imaging surveillance. *Given size (>/= 1 - 1.4 cm) and appearance, a follow-up ultrasound in 1 year should be considered based on TI-RADS criteria.   Nodule # 3:  The previously biopsied nodule in the left mid gland is similar in size at 2.4 x 1.7 x 1.2 cm compared to 2.3 x 1.6 x 1.2 cm previously.   No new nodules  or suspicious features.   IMPRESSION: 1. No significant interval change in size or appearance of the previously biopsied nodule in the left mid gland. 2. Slight interval enlargement of TI-RADS category 4 nodule in the left upper gland (labeled # 2). At 1.2 cm, this nodule now meets criteria for imaging surveillance. Recommend follow-up ultrasound in 1 year.        Left nodule FNA 11/13/2021    Clinical History: Left mid, 2.3 cm; Other 2 dimensions: 1.6 x 1.2 cm,  Solid/almost completely solid, Hypoechoic, TI-RADS total points: 4  Specimen Submitted:  A. THYROID, LEFT MID NODULE # 3, FINE NEEDLE  ASPIRATION:    FINAL MICROSCOPIC DIAGNOSIS:  - Consistent with benign follicular nodule ([employer] category II)  ASSESSMENT / PLAN / RECOMMENDATIONS:   1) Type 2 Diabetes Mellitus, Optimally controlled, Without complications - Most recent A1c of 5.8 %. Goal A1c < 7.0 %.    -A1c remains at goal  -A prescription for accu chek guide meter and strips sent to the pharmacy  - We opted to increase    MEDICATIONS: Increase Ozempic 2 mg weekly  EDUCATION / INSTRUCTIONS: BG monitoring instructions: Patient is instructed to check her blood sugars 2 times a week.    2) Diabetic complications:  Eye: Does not have known diabetic retinopathy.  Neuro/ Feet: Does not have known diabetic peripheral neuropathy .  Renal: Patient does not have known baseline CKD. She   is  on an ACEI/ARB at present.   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 -She has elevated anti-TPO antibody, but TRAB levels undetectable -She is clinically euthyroid -Repeat TFTs ***   Medications    Continue methimazole 5 mg, 1 tablet 3 days a week    4) Dyslipidemia    -LDL has trended up in the past  - Has been eating candy corn, we opted to check today   -Encouraged low-fat diet  Medication  Continue Atorvastatin 10 mg daily  5) Multinodular Goiter:  -No local neck symptoms -She is  S/P  left mid thyroid nodule 2.3 cm with benign cytology 11/2021 -Repeat US is stable       F/U in 6 months     Signed electronically by: Lyndle Herrlich, MD  [facility] Endocrinology  [facility] [address] [place] [place], [place] [address] Phone: [phone] FAX: [phone]   CC: Tacy Learn, FNP [address] [place] [address] Phone: [phone]  Fax: [phone]  Return to Endocrinology clinic as below: No future appointments.

## 2024-01-11 ENCOUNTER — Encounter: Payer: Self-pay | Admitting: Internal Medicine

## 2024-01-11 LAB — LIPID PANEL
Cholesterol: 155 mg/dL (ref <200)
HDL: 50 mg/dL (ref >=50)
LDL Cholesterol (Calc): 79 mg/dL
Non-HDL Cholesterol (Calc): 105 mg/dL (ref <130)
Total CHOL/HDL Ratio: 3.1 (calc) (ref <5.0)
Triglycerides: 162 mg/dL — ABNORMAL HIGH (ref <150)

## 2024-01-11 LAB — BASIC METABOLIC PANEL WITH GFR
BUN: 12 mg/dL (ref 7–25)
CO2: 31 mmol/L (ref 20–32)
Calcium: 9.4 mg/dL (ref 8.6–10.4)
Chloride: 102 mmol/L (ref 98–110)
Creat: 0.73 mg/dL (ref 0.50–1.03)
Glucose, Bld: 106 mg/dL — ABNORMAL HIGH (ref 65–99)
Potassium: 3.8 mmol/L (ref 3.5–5.3)
Sodium: 140 mmol/L (ref 135–146)
eGFR: 100 mL/min/{1.73_m2} (ref >=60)

## 2024-01-11 LAB — MICROALBUMIN / CREATININE URINE RATIO
Creatinine, Urine: 170 mg/dL (ref 20–275)
Microalb Creat Ratio: 2 mg/g{creat} (ref <30)
Microalb, Ur: 0.3 mg/dL

## 2024-01-11 LAB — TSH: TSH: 0.28 m[IU]/L — ABNORMAL LOW

## 2024-01-11 LAB — T4, FREE: Free T4: 0.9 ng/dL (ref 0.8–1.8)

## 2024-01-11 MED ORDER — METHIMAZOLE 5 MG PO TABS: 5.0000 mg | ORAL_TABLET | ORAL | 3 refills | Status: DC

## 2024-07-13 NOTE — Progress Notes (Signed)
 Name: Cassidy [name]  Age/ Sex: 53 y.o., female   MRN/ DOB: [id number], Nov 01, 1971     PCP: Erskine Neptune, FNP   Reason for Endocrinology Evaluation: Hyperthyroidism/Type 2 Diabetes Mellitus  Initial Endocrine Consultative Visit: 12/05/2020    PATIENT IDENTIFIER: Cassidy [name] is a 53 y.o. female with a past medical history of hyperthyroidism and T2DM. The patient has followed with Endocrinology clinic since 12/05/2020 for consultative assistance with management of her diabetes.     DIABETIC HISTORY:  Cassidy [name] was diagnosed with DM 04/2021, with an A1c 6.6%. She was started on Metfrmin through her PCP but was unable to tolerate.  In 06/2021 we switched  Metformin to Ozempic  due to GI issues with Metformin   She had normal 24-hr urinary cortisol for weight gain 06/2021    THYROID  HISTORY : She was found to have elevated Anti-TPO Ab at 147 IU/mL ( reference 0-34) and elevated Tg Ab's 642.9 IU/mL ( reference 0.0-0.9). Of note, the pt has normal total T3 at 143 ng/dL but low TSH at 9.73 uIU/mL  And normal FT4 at 1.09 ng/dL .   This was checked during routine check up    On her initial visit to our clinic she was noted to have a low TSH at 0.29 uIU/mL . Due to symptoms of fatigue, arthralgias, eye symptoms of discomfort and pruritic we opted to start methimazole .    TRAB is negative        She was started on Methimazole  12/2020   S/P left mid thyroid  nodule 2.3 cm with benign cytology 11/2021     Mother had questionable thyromegaly , cousin with Grave's disease    Thyroid  ultrasound showed questionable nodules    S/P hysterectomy      SUBJECTIVE:   During the last visit (01/10/2024): A1c 5.8%     Today (07/14/2024): Cassidy [name] is here for a follow up on Diabetes and hyperthyroidism.  She checks her blood sugars 1-2 twice weekly .    Patient has been noted weight loss She has noted with hot flashes  Has IBS -D but with Ozempic  its been imprv Has occasional palpitations  associated with leg cramps that she attributes to low potassium  No local neck symptoms    HOME ENDOCRINE REGIMEN:  Ozempic  2 mg weekly  Methimazole  5 mg , 1 tab four  days a week Atorvastatin  10 mg daily     Statin: no ACE-I/ARB: yes    METER DOWNLOAD later in the day: Did not bring    DIABETIC COMPLICATIONS: Microvascular complications:   Denies: neuropathy , retinopathy  Last Eye Exam: Completed 11/2022  Macrovascular complications:   Denies: CAD, CVA, PVD   HISTORY:  Past Medical History:  Past Medical History:  Diagnosis Date   ANEMIA-IRON DEFICIENCY 09/20/2007   ASTHMA 09/20/2007   COMMON MIGRAINE 01/02/2009   FEVER UNSPECIFIED 01/17/2008   FROZEN RIGHT SHOULDER 07/10/2009   GERD 01/02/2009   GLUCOSE INTOLERANCE 01/02/2009   Headache(784.0) 09/20/2007   Heart palpitations    Hx: of with low potassium levels   Hematuria 07/20/2011   HYPERTENSION 01/02/2009   Impaired glucose tolerance 07/17/2011   LATERAL EPICONDYLITIS, RIGHT 06/05/2009   Other and unspecified hyperlipidemia 01/15/2013   PONV (postoperative nausea and vomiting)    ROTATOR CUFF SYNDROME, RIGHT 05/27/2009   SHOULDER PAIN, RIGHT 05/27/2009   Past Surgical History:  Past Surgical History:  Procedure Laterality Date   ABDOMINAL HYSTERECTOMY     BIOPSY  07/03/2020   Procedure:  BIOPSY;  Surgeon: Golda Claudis PENNER, MD;  Location: [facility];  Service: Endoscopy;;   COLONOSCOPY WITH PROPOFOL  N/A 07/03/2020   Procedure: COLONOSCOPY WITH PROPOFOL ;  Surgeon: Golda Claudis PENNER, MD;  Location: [facility];  Service: Endoscopy;  Laterality: N/A;  730   ESOPHAGOGASTRODUODENOSCOPY (EGD) WITH PROPOFOL  N/A 07/03/2020   Procedure: ESOPHAGOGASTRODUODENOSCOPY (EGD) WITH PROPOFOL ;  Surgeon: Golda Claudis PENNER, MD;  Location: [facility];  Service: Endoscopy;  Laterality: N/A;   FRACTURE SURGERY     Hx: of left ankle   HARDWARE REMOVAL Left 03/10/2013   Procedure: HARDWARE REMOVAL;  Surgeon: Jerona LULLA Sage, MD;  Location: [facility];  Service: Orthopedics;  Laterality: Left;  Left Ankle Removal Medial Malleolus Screws   HYSTEROTOMY  2017   POLYPECTOMY  07/03/2020   Procedure: POLYPECTOMY;  Surgeon: Golda Claudis PENNER, MD;  Location: [facility];  Service: Endoscopy;;   s/p epicondylar release     s/p LEEP procedure 2000 for abnormal pap     Normal since then   s/p uterine ablation  2007   TUBAL LIGATION  1955   Social History:  reports that she has never smoked. She has never used smokeless tobacco. She reports that she does not drink alcohol and does not use drugs. Family History:  Family History  Problem Relation Age of Onset   Hypertension Father    Hyperlipidemia Father    Diabetes Father    Colon cancer Paternal Grandfather    Diabetes Mother      HOME MEDICATIONS: Allergies as of 07/14/2024       Reactions   Omeprazole     Augmentin [amoxicillin-pot Clavulanate]    Cleocin [clindamycin Hcl] Diarrhea, Nausea And Vomiting   Frovatriptan Other (See Comments)   parasthesia   Gabapentin (once-daily)    Montelukast Sodium Other (See Comments)   parasthesia   Morphine And Codeine Nausea And Vomiting   And dizzy, and feeling terrible   Propranolol Hcl    unknown   Singulair [montelukast]    Sumatriptan Other (See Comments)   Masked asthma   Vancomycin Other (See Comments)   Had reaction during recovery, maybe hives or rash   Benzoin Rash   And blisters        Medication List        Accurate as of July 14, 2024  7:30 AM. If you have any questions, ask your nurse or doctor.          Accu-Chek Guide Test test strip Generic drug: glucose blood 1 each by Other route daily in the afternoon. Use as instructed   Accu-Chek Guide w/Device Kit 1 Device by Does not apply route daily in the afternoon.   albuterol  108 (90 Base) MCG/ACT inhaler Commonly known as: VENTOLIN  HFA Inhale 2 puffs into the lungs every 6 (six) hours as needed for wheezing.   APPLE CIDER VINEGAR DIET PO Take 1  capsule by mouth daily.   atorvastatin  10 MG tablet Commonly known as: LIPITOR Take 1 tablet (10 mg total) by mouth daily.   cetirizine  10 MG tablet Commonly known as: ZYRTEC  Take 10 mg by mouth daily.   clobetasol 0.05 % external solution Commonly known as: TEMOVATE APPLY TO SCALP ONCE A DAY AS NEEDED FOR ITCHING   dexlansoprazole  60 MG capsule Commonly known as: Dexilant  Take 1 capsule (60 mg total) by mouth daily.   estradiol 0.05 MG/24HR patch Commonly known as: VIVELLE-DOT Apply 1 patch twice a week by transdermal route.   fexofenadine   180 MG tablet Commonly known as: ALLEGRA  Take 1 tablet (180 mg total) by mouth every evening. What changed:  when to take this reasons to take this   hydrOXYzine 25 MG tablet Commonly known as: ATARAX Take 25 mg by mouth 3 (three) times daily as needed for itching. As needed   ketoconazole 2 % shampoo Commonly known as: NIZORAL PLEASE SEE ATTACHED FOR DETAILED DIRECTIONS   levalbuterol 45 MCG/ACT inhaler Commonly known as: XOPENEX HFA PLEASE SEE ATTACHED FOR DETAILED DIRECTIONS   losartan-hydrochlorothiazide  100-25 MG tablet Commonly known as: HYZAAR Take 1 tablet by mouth daily.   Melatonin 10 MG Tabs Take 10 mg by mouth every Monday, Tuesday, Wednesday, Thursday, and Friday.   methimazole  5 MG tablet Commonly known as: TAPAZOLE  Take 1 tablet (5 mg total) by mouth as directed. 1 tablet 4 times a week   multivitamin with minerals Tabs tablet Take 1 tablet by mouth every evening.   pregabalin 100 MG capsule Commonly known as: LYRICA Take 100 mg by mouth 3 (three) times daily as needed.   progesterone 100 MG capsule Commonly known as: PROMETRIUM Take 1 tablet by mouth daily.   rizatriptan  10 MG tablet Commonly known as: MAXALT  Take 1 tablet (10 mg total) by mouth daily as needed for migraine. 1 tablet by mouth for Migraine.  May repeat in 2 hours (no more than 2 in 24 hours or 2-3 days a week).   Semaglutide  (2  MG/DOSE) 8 MG/3ML Sopn Inject 2 mg as directed once a week.   Vitamin D 125 MCG (5000 UT) Caps Take 5,000 Units by mouth daily.         OBJECTIVE:   Vital Signs: BP 122/80 (BP Location: Right Arm, Patient Position: Sitting, Cuff Size: Normal)   Pulse 76   Ht 5' 7 (1.702 m)   Wt 265 lb (120.2 kg)   LMP 02/06/2013   SpO2 98%   BMI 41.50 kg/m   Wt Readings from Last 3 Encounters:  07/14/24 265 lb (120.2 kg)  01/10/24 275 lb (124.7 kg)  07/09/23 270 lb (122.5 kg)     Exam: General: Pt appears well and is in NAD  Neck: General: Supple without adenopathy. Thyroid : Thyroid  size normal.  No goiter or nodules appreciated.   Lungs: Clear with good BS bilat   Heart: RRR  Extremities: No pretibial edema.  Neuro: MS is good with appropriate affect, pt is alert and Ox3    DM Foot Exam 07/14/2024 The skin of the feet is intact without sores or ulcerations. The pedal pulses are 2+ on right and 2+ on left. The sensation is intact to a screening 5.07, 10 gram monofilament bilaterally     DATA REVIEWED:  Lab Results  Component Value Date   HGBA1C 5.8 (A) 01/10/2024   HGBA1C 5.9 (A) 07/09/2023   HGBA1C 5.9 (A) 01/01/2023     Latest Reference Range & Units 07/14/24 07:54  TSH mIU/L 0.36 (L)  Triiodothyronine,Free,Serum 2.3 - 4.2 pg/mL 3.4  T4,Free(Direct) 0.8 - 1.8 ng/dL 1.0    Thyroid  ultrasound 07/16/2023   Estimated total number of nodules >/= 1 cm: 2   Number of spongiform nodules >/=  2 cm not described below (TR1): 0   Number of mixed cystic and solid nodules >/= 1.5 cm not described below (TR2): 0   _________________________________________________________   Nodule # 2: Hypoechoic solid nodule in the left upper gland measures 1.2 x 0.8 x 0.5 cm. This is slightly enlarged compared to a maximum of  0.8 cm previously. This lesion is consistent with TI-RADS category 4 and now meets criteria for imaging surveillance. *Given size (>/= 1 - 1.4 cm) and appearance,  a follow-up ultrasound in 1 year should be considered based on TI-RADS criteria.   Nodule # 3: The previously biopsied nodule in the left mid gland is similar in size at 2.4 x 1.7 x 1.2 cm compared to 2.3 x 1.6 x 1.2 cm previously.   No new nodules or suspicious features.   IMPRESSION: 1. No significant interval change in size or appearance of the previously biopsied nodule in the left mid gland. 2. Slight interval enlargement of TI-RADS category 4 nodule in the left upper gland (labeled # 2). At 1.2 cm, this nodule now meets criteria for imaging surveillance. Recommend follow-up ultrasound in 1 year.        Left nodule FNA 11/13/2021    Clinical History: Left mid, 2.3 cm; Other 2 dimensions: 1.6 x 1.2 cm,  Solid/almost completely solid, Hypoechoic, TI-RADS total points: 4  Specimen Submitted:  A. THYROID , LEFT MID NODULE # 3, FINE NEEDLE  ASPIRATION:    FINAL MICROSCOPIC DIAGNOSIS:  - Consistent with benign follicular nodule ([employer] category II)    ASSESSMENT / PLAN / RECOMMENDATIONS:   1) Type 2 Diabetes Mellitus, Optimally controlled, Without complications - Most recent A1c of 5.6 %. Goal A1c < 7.0 %.    -A1c remains at goal  -A prescription for accu chek guide meter and strips sent to the pharmacy  - We opted to increase Ozempic  as below   MEDICATIONS: Continue Ozempic  2 mg weekly  EDUCATION / INSTRUCTIONS: BG monitoring instructions: Patient is instructed to check her blood sugars 2 times a week.    2) Diabetic complications:  Eye: Does not have known diabetic retinopathy.  Neuro/ Feet: Does not have known diabetic peripheral neuropathy .  Renal: Patient does not have known baseline CKD. She   is  on an ACEI/ARB at present.   3) Hyperthyroidism:  -Suspect autonomous thyroid  nodules -She has elevated anti-TPO antibody, but TRAB levels undetectable -She is clinically euthyroid - Repeat TFTs shows free T4, TSH continues to be low but is trending  upwards, will increase methimazole  as below Medications    Increase methimazole  5 mg, 1 tablet 5 days a week    4) Dyslipidemia    -LDL at goal - No change   Medication  Continue Atorvastatin  10 mg daily  5) Multinodular Goiter:  -No local neck symptoms -She is  S/P left mid thyroid  nodule 2.3 cm with benign cytology 11/2021 -Repeat US  showed stability in 2024, will repeat      F/U in 6 months     Signed electronically by: Stefano Redgie Butts, MD  [facility] Endocrin[facility]  [facility] [address] [place] [place], [place] [address] Phone: [phone] FAX: [phone]   CC: Erskine Neptune, FNP [address] [place] [address] Phone: [phone]  Fax: [phone]  Return to Endocrinology clinic as below: No future appointments.

## 2024-07-14 ENCOUNTER — Ambulatory Visit: Admitting: Internal Medicine

## 2024-07-14 ENCOUNTER — Encounter: Payer: Self-pay | Admitting: Internal Medicine

## 2024-07-14 VITALS — BP 122/80 | HR 76 | Ht 67.0 in | Wt 265.0 lb

## 2024-07-14 DIAGNOSIS — E119 Type 2 diabetes mellitus without complications: Secondary | ICD-10-CM | POA: Diagnosis not present

## 2024-07-14 DIAGNOSIS — E042 Nontoxic multinodular goiter: Secondary | ICD-10-CM

## 2024-07-14 DIAGNOSIS — R635 Abnormal weight gain: Secondary | ICD-10-CM | POA: Diagnosis not present

## 2024-07-14 DIAGNOSIS — E785 Hyperlipidemia, unspecified: Secondary | ICD-10-CM

## 2024-07-14 DIAGNOSIS — Z7985 Long-term (current) use of injectable non-insulin antidiabetic drugs: Secondary | ICD-10-CM

## 2024-07-14 DIAGNOSIS — E0590 Thyrotoxicosis, unspecified without thyrotoxic crisis or storm: Secondary | ICD-10-CM

## 2024-07-14 LAB — POCT GLYCOSYLATED HEMOGLOBIN (HGB A1C): Hemoglobin A1C: 5.6 % (ref 4.0–5.6)

## 2024-07-14 LAB — POCT GLUCOSE (DEVICE FOR HOME USE): Glucose Fasting, POC: 107 mg/dL — AB (ref 70–99)

## 2024-07-14 NOTE — Patient Instructions (Signed)
   Continue Ozempic 2 mg weekly  

## 2024-07-17 ENCOUNTER — Ambulatory Visit: Payer: Self-pay | Admitting: Internal Medicine

## 2024-07-17 DIAGNOSIS — R635 Abnormal weight gain: Secondary | ICD-10-CM | POA: Insufficient documentation

## 2024-07-17 MED ORDER — METHIMAZOLE 5 MG PO TABS: 5.0000 mg | ORAL_TABLET | ORAL | 3 refills | Status: AC

## 2024-07-18 LAB — TSH: TSH: 0.36 m[IU]/L — ABNORMAL LOW

## 2024-07-18 LAB — CORTISOL: Cortisol, Plasma: 10.4 ug/dL

## 2024-07-18 LAB — T3, FREE: T3, Free: 3.4 pg/mL (ref 2.3–4.2)

## 2024-07-18 LAB — ACTH: C206 ACTH: 21 pg/mL (ref 6–50)

## 2024-07-18 LAB — T4, FREE: Free T4: 1 ng/dL (ref 0.8–1.8)

## 2024-07-31 LAB — OPHTHALMOLOGY REPORT-SCANNED

## 2024-08-16 ENCOUNTER — Encounter (INDEPENDENT_AMBULATORY_CARE_PROVIDER_SITE_OTHER): Payer: Self-pay | Admitting: Gastroenterology

## 2024-08-23 ENCOUNTER — Other Ambulatory Visit: Payer: Self-pay | Admitting: Medical Genetics

## 2024-08-23 DIAGNOSIS — Z006 Encounter for examination for normal comparison and control in clinical research program: Secondary | ICD-10-CM

## 2024-08-30 ENCOUNTER — Ambulatory Visit
Admission: RE | Admit: 2024-08-30 | Discharge: 2024-08-30 | Disposition: A | Discharge status: Home / Self Care | Source: Ambulatory Visit | Attending: Internal Medicine | Admitting: Internal Medicine

## 2024-08-30 DIAGNOSIS — E042 Nontoxic multinodular goiter: Secondary | ICD-10-CM

## 2024-09-04 ENCOUNTER — Other Ambulatory Visit

## 2024-09-19 LAB — GENECONNECT MOLECULAR SCREEN: Genetic Analysis Overall Interpretation: NEGATIVE

## 2025-01-15 ENCOUNTER — Ambulatory Visit: Admitting: Internal Medicine
